# Patient Record
Sex: Male | Born: 1960 | Race: Black or African American | Hispanic: No | Marital: Single | Smoking: Current every day smoker
Health system: Southern US, Community
[De-identification: ages and names within clinical notes are randomized; demographics above are authoritative.]

## PROBLEM LIST (undated history)

## (undated) DIAGNOSIS — K219 Gastro-esophageal reflux disease without esophagitis: Secondary | ICD-10-CM

## (undated) HISTORY — PX: COLONOSCOPY: SHX174

## (undated) HISTORY — DX: Gastro-esophageal reflux disease without esophagitis: K21.9

---

## 2005-01-08 ENCOUNTER — Ambulatory Visit (HOSPITAL_COMMUNITY): Admission: RE | Admit: 2005-01-08 | Discharge: 2005-01-08 | Payer: Self-pay | Admitting: Family Medicine

## 2006-01-06 ENCOUNTER — Emergency Department (HOSPITAL_COMMUNITY): Admission: EM | Admit: 2006-01-06 | Discharge: 2006-01-06 | Payer: Self-pay | Admitting: Family Medicine

## 2007-06-08 ENCOUNTER — Emergency Department (HOSPITAL_COMMUNITY): Admission: EM | Admit: 2007-06-08 | Discharge: 2007-06-08 | Payer: Self-pay | Admitting: Emergency Medicine

## 2007-09-17 ENCOUNTER — Emergency Department (HOSPITAL_COMMUNITY): Admission: EM | Admit: 2007-09-17 | Discharge: 2007-09-17 | Payer: Self-pay | Admitting: Emergency Medicine

## 2011-09-17 LAB — POCT I-STAT CREATININE: Operator id: 288331

## 2011-09-17 LAB — POCT CARDIAC MARKERS
CKMB, poc: 1.4
Myoglobin, poc: 68
Operator id: 288331
Troponin i, poc: <0.05

## 2011-09-17 LAB — I-STAT 8, (EC8 V) (CONVERTED LAB)
Glucose, Bld: 98
Potassium: 3.5
TCO2: 27
pCO2, Ven: 47.7
pH, Ven: 7.342 — ABNORMAL HIGH

## 2012-02-18 ENCOUNTER — Ambulatory Visit (INDEPENDENT_AMBULATORY_CARE_PROVIDER_SITE_OTHER): Payer: BC Managed Care – PPO | Admitting: Family Medicine

## 2012-02-18 VITALS — BP 101/68 | HR 59 | Temp 98.8°F | Resp 16 | Ht 68.0 in | Wt 178.0 lb

## 2012-02-18 DIAGNOSIS — E785 Hyperlipidemia, unspecified: Secondary | ICD-10-CM

## 2012-02-18 DIAGNOSIS — R21 Rash and other nonspecific skin eruption: Secondary | ICD-10-CM

## 2012-02-18 DIAGNOSIS — R5383 Other fatigue: Secondary | ICD-10-CM

## 2012-02-18 DIAGNOSIS — Z0000 Encounter for general adult medical examination without abnormal findings: Secondary | ICD-10-CM

## 2012-02-18 LAB — POCT URINALYSIS DIPSTICK
Bilirubin, UA: NEGATIVE
Glucose, UA: NEGATIVE
Ketones, UA: NEGATIVE
Leukocytes, UA: NEGATIVE
Nitrite, UA: NEGATIVE
Protein, UA: NEGATIVE
Spec Grav, UA: 1.025
Urobilinogen, UA: 0.2
pH, UA: 5

## 2012-02-18 LAB — POCT CBC
Granulocyte percent: 37 %G (ref 37–80)
HCT, POC: 48.7 % (ref 43.5–53.7)
Hemoglobin: 15.8 g/dL (ref 14.1–18.1)
Lymph, poc: 2.6 (ref 0.6–3.4)
MCH, POC: 29.5 pg (ref 27–31.2)
MCHC: 32.4 g/dL (ref 31.8–35.4)
MCV: 90.9 fL (ref 80–97)
MID (cbc): 0.5 (ref 0–0.9)
MPV: 9.6 fL (ref 0–99.8)
POC Granulocyte: 1.8 — AB (ref 2–6.9)
POC LYMPH PERCENT: 52.4 %L — AB (ref 10–50)
POC MID %: 10.6 %M (ref 0–12)
Platelet Count, POC: 224 10*3/uL (ref 142–424)
RBC: 5.36 M/uL (ref 4.69–6.13)
RDW, POC: 13.5 %
WBC: 5 10*3/uL (ref 4.6–10.2)

## 2012-02-18 LAB — POCT UA - MICROSCOPIC ONLY
Amorphous: POSITIVE
Bacteria, U Microscopic: NEGATIVE
Casts, Ur, LPF, POC: NEGATIVE
Crystals, Ur, HPF, POC: NEGATIVE
Epithelial cells, urine per micros: NEGATIVE
Mucus, UA: NEGATIVE
WBC, Ur, HPF, POC: NEGATIVE
Yeast, UA: NEGATIVE

## 2012-02-18 NOTE — Progress Notes (Signed)
51 year old man from [place] who works full-time in the [employer] and also [employer] in his spare time. He is married with 5 children age 81-1/2-23. He was born with a congenital alternating exophoria. Been feeling fatigued now for quite some time and he thinks his partially related to his hard work to [employer].  Objective: No acute distress presently.  HEENT: Unremarkable except for the alternating exophoria.  Neck: Supple no adenopathy  Chest: Clear to auscultation, acute scars on thoracic spine  Heart: Regular no murmur or gallop  Abdomen: Soft nontender without HSM or masses  Genitalia: Annular half centimeters lesion with scab-like surface on the right upper scrotum, circumcise, otherwise in normal  Assessment: Fatigue, scrotal rash, exophoria.  Plan usual lab labs plus RPR, check TSH for fatigue him a set up for colonoscopy

## 2012-02-18 NOTE — Patient Instructions (Signed)
heaHealth Maintenance, Males A healthy lifestyle and preventative care can promote health and wellness.  Maintain regular health, dental, and eye exams.   Eat a healthy diet. Foods like vegetables, fruits, whole grains, low-fat dairy products, and lean protein foods contain the nutrients you need without too many calories. Decrease your intake of foods high in solid fats, added sugars, and salt. Get information about a proper diet from your caregiver, if necessary.   Regular physical exercise is one of the most important things you can do for your health. Most adults should get at least 150 minutes of moderate-intensity exercise (any activity that increases your heart rate and causes you to sweat) each week. In addition, most adults need muscle-strengthening exercises on 2 or more days a week.    Maintain a healthy weight. The body mass index (BMI) is a screening tool to identify possible weight problems. It provides an estimate of body fat based on height and weight. Your caregiver can help determine your BMI, and can help you achieve or maintain a healthy weight. For adults 20 years and older:   A BMI below 18.5 is considered underweight.   A BMI of 18.5 to 24.9 is normal.   A BMI of 25 to 29.9 is considered overweight.   A BMI of 30 and above is considered obese.   Maintain normal blood lipids and cholesterol by exercising and minimizing your intake of saturated fat. Eat a balanced diet with plenty of fruits and vegetables. Blood tests for lipids and cholesterol should begin at age 3 and be repeated every 5 years. If your lipid or cholesterol levels are high, you are over 50, or you are a high risk for heart disease, you may need your cholesterol levels checked more frequently.Ongoing high lipid and cholesterol levels should be treated with medicines, if diet and exercise are not effective.   If you smoke, find out from your caregiver how to quit. If you do not use tobacco, do not start.     If you choose to drink alcohol, do not exceed 2 drinks per day. One drink is considered to be 12 ounces (355 mL) of beer, 5 ounces (148 mL) of wine, or 1.5 ounces (44 mL) of liquor.   Avoid use of street drugs. Do not share needles with anyone. Ask for help if you need support or instructions about stopping the use of drugs.   High blood pressure causes heart disease and increases the risk of stroke. Blood pressure should be checked at least every 1 to 2 years. Ongoing high blood pressure should be treated with medicines if weight loss and exercise are not effective.   If you are 55 to 52 years old, ask your caregiver if you should take aspirin to prevent heart disease.   Diabetes screening involves taking a blood sample to check your fasting blood sugar level. This should be done once every 3 years, after age 86, if you are within normal weight and without risk factors for diabetes. Testing should be considered at a younger age or be carried out more frequently if you are overweight and have at least 1 risk factor for diabetes.   Colorectal cancer can be detected and often prevented. Most routine colorectal cancer screening begins at the age of 87 and continues through age 29. However, your caregiver may recommend screening at an earlier age if you have risk factors for colon cancer. On a yearly basis, your caregiver may provide home test kits to check for  hidden blood in the stool. Use of a small camera at the end of a tube, to directly examine the colon (sigmoidoscopy or colonoscopy), can detect the earliest forms of colorectal cancer. Talk to your caregiver about this at age 1, when routine screening begins. Direct examination of the colon should be repeated every 5 to 10 years through age 34, unless early forms of pre-cancerous polyps or small growths are found.   Hepatitis C blood testing is recommended for all people born from [birth year] through 1966 and any individual with known risks for  hepatitis C.   Healthy men should no longer receive prostate-specific antigen (PSA) blood tests as part of routine cancer screening. Consult with your caregiver about prostate cancer screening.   Testicular cancer screening is not recommended for adolescents or adult males who have no symptoms. Screening includes self-exam, caregiver exam, and other screening tests. Consult with your caregiver about any symptoms you have or any concerns you have about testicular cancer.   Practice safe sex. Use condoms and avoid high-risk sexual practices to reduce the spread of sexually transmitted infections (STIs).   Use sunscreen with a sun protection factor (SPF) of 30 or greater. Apply sunscreen liberally and repeatedly throughout the day. You should seek shade when your shadow is shorter than you. Protect yourself by wearing long sleeves, pants, a wide-brimmed hat, and sunglasses year round, whenever you are outdoors.   Notify your caregiver of new moles or changes in moles, especially if there is a change in shape or color. Also notify your caregiver if a mole is larger than the size of a pencil eraser.   A one-time screening for abdominal aortic aneurysm (AAA) and surgical repair of large AAAs by sound wave imaging (ultrasonography) is recommended for ages 80 to 38 years who are current or former smokers.   Stay current with your immunizations.  Document Released: 05/16/2008 Document Revised: 11/07/2011 Document Reviewed: 04/15/2011 [facility] Patient Information 2012 [place], [place].

## 2012-02-19 LAB — LIPID PANEL
Cholesterol: 189 mg/dL (ref 0–200)
HDL: 42 mg/dL (ref >39)
LDL Cholesterol: 115 mg/dL — ABNORMAL HIGH (ref 0–99)
Total CHOL/HDL Ratio: 4.5 Ratio
Triglycerides: 162 mg/dL — ABNORMAL HIGH (ref <150)
VLDL: 32 mg/dL (ref 0–40)

## 2012-02-19 LAB — COMPREHENSIVE METABOLIC PANEL
ALT: 16 U/L (ref 0–53)
AST: 29 U/L (ref 0–37)
Albumin: 4.4 g/dL (ref 3.5–5.2)
Alkaline Phosphatase: 78 U/L (ref 39–117)
BUN: 15 mg/dL (ref 6–23)
CO2: 26 mEq/L (ref 19–32)
Calcium: 9.6 mg/dL (ref 8.4–10.5)
Chloride: 102 mEq/L (ref 96–112)
Creat: 1.21 mg/dL (ref 0.50–1.35)
Glucose, Bld: 83 mg/dL (ref 70–99)
Potassium: 4.1 mEq/L (ref 3.5–5.3)
Sodium: 139 mEq/L (ref 135–145)
Total Bilirubin: 0.4 mg/dL (ref 0.3–1.2)
Total Protein: 7.1 g/dL (ref 6.0–8.3)

## 2012-02-19 LAB — RPR

## 2012-02-19 LAB — TSH: TSH: 0.93 u[IU]/mL (ref 0.350–4.500)

## 2012-05-08 DIAGNOSIS — D126 Benign neoplasm of colon, unspecified: Secondary | ICD-10-CM | POA: Insufficient documentation

## 2012-08-27 ENCOUNTER — Encounter: Payer: Self-pay | Admitting: Family Medicine

## 2012-08-27 DIAGNOSIS — D126 Benign neoplasm of colon, unspecified: Secondary | ICD-10-CM

## 2012-11-30 ENCOUNTER — Ambulatory Visit (INDEPENDENT_AMBULATORY_CARE_PROVIDER_SITE_OTHER): Payer: BC Managed Care – PPO | Admitting: Family Medicine

## 2012-11-30 ENCOUNTER — Encounter: Payer: Self-pay | Admitting: Family Medicine

## 2012-11-30 VITALS — BP 118/76 | HR 60 | Temp 98.5°F | Resp 16 | Ht 68.0 in | Wt 179.2 lb

## 2012-11-30 DIAGNOSIS — F17200 Nicotine dependence, unspecified, uncomplicated: Secondary | ICD-10-CM

## 2012-11-30 DIAGNOSIS — Z720 Tobacco use: Secondary | ICD-10-CM

## 2012-11-30 DIAGNOSIS — J329 Chronic sinusitis, unspecified: Secondary | ICD-10-CM

## 2012-11-30 DIAGNOSIS — J40 Bronchitis, not specified as acute or chronic: Secondary | ICD-10-CM

## 2012-11-30 DIAGNOSIS — J029 Acute pharyngitis, unspecified: Secondary | ICD-10-CM

## 2012-11-30 MED ORDER — HYDROCODONE-HOMATROPINE 5-1.5 MG/5ML PO SYRP: 5.0000 mL | ORAL_SOLUTION | ORAL | Status: DC | PRN

## 2012-11-30 MED ORDER — AMOXICILLIN 875 MG PO TABS: 875.0000 mg | ORAL_TABLET | Freq: Two times a day (BID) | ORAL | Status: DC

## 2012-11-30 NOTE — Patient Instructions (Signed)
Drink lots of fluids  Take medications as directed  Return if fever or cough or shortness of breath get worse.  Make a decision to quit smoking. Do not say you're going to try to quit smoking, but that you are going to quit smoking. Try to taper back to about 3 cigarettes a day before the day you were going to quit.Pick a day on which you're going to quit, throw away the cigarettes, and do not touch another one. You are addicted to the cigarettes, and if you smoke Probably go back to smoking. You must make up your mind that you're not going to smoke any.

## 2012-11-30 NOTE — Progress Notes (Signed)
Subjective: Patient has been ill for over a week with a respiratory problem. It started as a head cold and now has moved into his chest. He has some sore throat. He is coughing. He has had fevers. his children have been sick.. He does still smoke. We had a discussion about his need to quit. He is only smoking about half a dozen cigarettes a day. I told him he does not need to try to quit, he just needs to make up his mind and do it.  Objective: No acute distress. Wheezy on the right. Throat mildly erythematous. Neck supple small nodes. His TMs are normal. Chest is clear to auscultation. Heart regular.  Assessment: Sinusitis/pharyngitis/bronchitis  Plan: Amoxicillin Cough medicine Return if worse.

## 2013-11-30 ENCOUNTER — Ambulatory Visit (INDEPENDENT_AMBULATORY_CARE_PROVIDER_SITE_OTHER): Payer: BC Managed Care – PPO | Admitting: Emergency Medicine

## 2013-11-30 VITALS — BP 100/60 | HR 58 | Temp 98.9°F | Resp 18 | Ht 68.5 in | Wt 180.0 lb

## 2013-11-30 DIAGNOSIS — Z0000 Encounter for general adult medical examination without abnormal findings: Secondary | ICD-10-CM

## 2013-11-30 LAB — POCT CBC
HCT, POC: 53.2 % (ref 43.5–53.7)
Hemoglobin: 17.2 g/dL (ref 14.1–18.1)
Lymph, poc: 2.6 (ref 0.6–3.4)
MCH, POC: 31 pg (ref 27–31.2)
MCHC: 32.3 g/dL (ref 31.8–35.4)
MPV: 10.3 fL (ref 0–99.8)
POC Granulocyte: 2.4 (ref 2–6.9)
POC LYMPH PERCENT: 48.7 %L (ref 10–50)
POC MID %: 7.5 %M (ref 0–12)
RDW, POC: 13.7 %
WBC: 5.4 10*3/uL (ref 4.6–10.2)

## 2013-11-30 LAB — POCT URINALYSIS DIPSTICK
Bilirubin, UA: NEGATIVE
Ketones, UA: NEGATIVE
Protein, UA: NEGATIVE
pH, UA: 5.5

## 2013-11-30 NOTE — Progress Notes (Signed)
[facility] [address] [place] [address] [phone]  Date:  11/30/2013   Name:  Dennis Wiggins   DOB:  04-12-1961   MRN:  [id number]  PCP:  No primary provider on file.    Chief Complaint: Annual Exam   History of Present Illness:  Dennis Wiggins is a 52 y.o. very pleasant male patient who presents with the following:  Comes for a physical.  No medications, no medical problems.  Has pain in his chest when he is at work with lifting and straining. Never at home with exertion, eating, sex, etc.  Smokes less than 1 ppd.  No flu shot.  No improvement with over the counter medications or other home remedies. Denies other complaint or health concern today.   Had colonoscopy 6 months ago  Patient Active Problem List   Diagnosis Date Noted  . Benign neoplasm of colon 05/08/2012    History reviewed. No pertinent past medical history.  History reviewed. No pertinent past surgical history.  History  Substance Use Topics  . Smoking status: Current Every Day Smoker  . Smokeless tobacco: Not on file  . Alcohol Use: Not on file    Family History  Problem Relation Age of Onset  . Cancer Mother     No Known Allergies  Medication list has been reviewed and updated.  Current Outpatient Prescriptions on File Prior to Visit  Medication Sig Dispense Refill  . amoxicillin (AMOXIL) 875 MG tablet Take 1 tablet (875 mg total) by mouth 2 (two) times daily.  20 tablet  0  . HYDROcodone-homatropine (HYCODAN) 5-1.5 MG/5ML syrup Take 5 mLs by mouth every 4 (four) hours as needed for cough.  120 mL  0   No current facility-administered medications on file prior to visit.    Review of Systems:  As per HPI, otherwise negative.    Physical Examination: Filed Vitals:   11/30/13 1241  BP: 100/60  Pulse: 58  Temp: 98.9 F (37.2 C)  Resp: 18   Filed Vitals:   11/30/13 1241  Height: 5' 8.5" (1.74 m)  Weight: 180 lb (81.647 kg)   Body mass index is 26.97  kg/(m^2). Ideal Body Weight: Weight in (lb) to have BMI = 25: 166.5  GEN: WDWN, NAD, Non-toxic, A & O x 3 HEENT: Atraumatic, Normocephalic. Neck supple. No masses, No LAD.  Strabismus Ears and Nose: No external deformity. CV: RRR, No M/G/R. No JVD. No thrill. No extra heart sounds. PULM: CTA B, no wheezes, crackles, rhonchi. No retractions. No resp. distress. No accessory muscle use. ABD: S, NT, ND, +BS. No rebound. No HSM. EXTR: No c/c/e NEURO Normal gait.  PSYCH: Normally interactive. Conversant. Not depressed or anxious appearing.  Calm demeanor.    Assessment and Plan: CPE Labs  Signed,  Phillips Odor, MD

## 2013-12-01 ENCOUNTER — Telehealth: Payer: Self-pay

## 2013-12-01 LAB — LIPID PANEL
Cholesterol: 144 mg/dL (ref 0–200)
HDL: 36 mg/dL — ABNORMAL LOW (ref >39)
Total CHOL/HDL Ratio: 4 Ratio
Triglycerides: 196 mg/dL — ABNORMAL HIGH (ref <150)

## 2013-12-01 LAB — COMPREHENSIVE METABOLIC PANEL
BUN: 10 mg/dL (ref 6–23)
CO2: 29 mEq/L (ref 19–32)
Calcium: 9.7 mg/dL (ref 8.4–10.5)
Chloride: 103 mEq/L (ref 96–112)
Creat: 1.08 mg/dL (ref 0.50–1.35)
Total Bilirubin: 0.4 mg/dL (ref 0.3–1.2)

## 2013-12-01 NOTE — Telephone Encounter (Signed)
Pt calling for lab results.

## 2013-12-02 NOTE — Telephone Encounter (Signed)
Patient has been advised

## 2014-09-19 ENCOUNTER — Ambulatory Visit (INDEPENDENT_AMBULATORY_CARE_PROVIDER_SITE_OTHER): Payer: BC Managed Care – PPO | Admitting: Physician Assistant

## 2014-09-19 VITALS — BP 122/76 | HR 65 | Temp 98.8°F | Resp 17 | Ht 68.5 in | Wt 179.0 lb

## 2014-09-19 DIAGNOSIS — J069 Acute upper respiratory infection, unspecified: Secondary | ICD-10-CM

## 2014-09-19 DIAGNOSIS — R059 Cough, unspecified: Secondary | ICD-10-CM

## 2014-09-19 DIAGNOSIS — H9312 Tinnitus, left ear: Secondary | ICD-10-CM

## 2014-09-19 DIAGNOSIS — R05 Cough: Secondary | ICD-10-CM

## 2014-09-19 DIAGNOSIS — H509 Unspecified strabismus: Secondary | ICD-10-CM

## 2014-09-19 DIAGNOSIS — H5052 Exophoria: Secondary | ICD-10-CM | POA: Insufficient documentation

## 2014-09-19 MED ORDER — IPRATROPIUM BROMIDE 0.03 % NA SOLN: 2.0000 | Freq: Two times a day (BID) | NASAL | Status: DC

## 2014-09-19 MED ORDER — GUAIFENESIN ER 1200 MG PO TB12: 1.0000 | ORAL_TABLET | Freq: Two times a day (BID) | ORAL | Status: DC | PRN

## 2014-09-19 MED ORDER — ALBUTEROL SULFATE (2.5 MG/3ML) 0.083% IN NEBU
2.5000 mg | INHALATION_SOLUTION | Freq: Once | RESPIRATORY_TRACT | Status: AC
  Administered 2014-09-19: 2.5 mg via RESPIRATORY_TRACT

## 2014-09-19 MED ORDER — HYDROCOD POLST-CHLORPHEN POLST 10-8 MG/5ML PO LQCR: 5.0000 mL | Freq: Two times a day (BID) | ORAL | Status: DC | PRN

## 2014-09-19 MED ORDER — ALBUTEROL SULFATE HFA 108 (90 BASE) MCG/ACT IN AERS: 2.0000 | INHALATION_SPRAY | RESPIRATORY_TRACT | Status: DC | PRN

## 2014-09-19 MED ORDER — IPRATROPIUM BROMIDE 0.02 % IN SOLN
0.5000 mg | Freq: Once | RESPIRATORY_TRACT | Status: AC
  Administered 2014-09-19: 0.5 mg via RESPIRATORY_TRACT

## 2014-09-19 NOTE — Progress Notes (Signed)
Subjective:    Patient ID: Dennis Wiggins, male    DOB: Oct 21, 1961, 53 y.o.   MRN: [id number]   PCP: No primary provider on file.  Chief Complaint  Patient presents with  . Cough  . URI  . Headache  . Otalgia    Medications, allergies, past medical history, surgical history, family history, social history and problem list reviewed and updated.  HPI  This 53 y.o. male presents for evaluation of upper respiratory illness.  3 days of chills, runny nose, sneezing, headache. Chest congestion and wheezing. No sore throat. No ear pain. Some cough. No known sick contacts. No myalgias/arthralgias or rash.  In addition, he reports Pulsatile hum in the LEFT ear for about a month. Only occurs at bedtime.   Review of Systems As above.    Objective:   Physical Exam  Vitals reviewed. Constitutional: He is oriented to person, place, and time. Vital signs are normal. He appears well-developed and well-nourished. No distress.  BP 122/76  Pulse 65  Temp(Src) 98.8 F (37.1 C) (Oral)  Resp 17  Ht 5' 8.5" (1.74 m)  Wt 179 lb (81.194 kg)  BMI 26.82 kg/m2  SpO2 97%   HENT:  Head: Normocephalic and atraumatic.  Right Ear: Hearing, tympanic membrane, external ear and ear canal normal.  Left Ear: Hearing and external ear normal. A foreign body (tip of a cotton swab initially obscurred the TM. Removed with alligator forceps) is present. Tympanic membrane is retracted (mildly, and dulled to light). Tympanic membrane is not injected, not scarred, not perforated and not erythematous.  Nose: Mucosal edema present.  No foreign bodies. Right sinus exhibits no maxillary sinus tenderness and no frontal sinus tenderness. Left sinus exhibits no maxillary sinus tenderness and no frontal sinus tenderness.  Mouth/Throat: Uvula is midline, oropharynx is clear and moist and mucous membranes are normal. No uvula swelling. No oropharyngeal exudate.  Eyes: Conjunctivae are normal. Pupils are equal, round, and  reactive to light. Right eye exhibits no discharge. Left eye exhibits no discharge. No scleral icterus. Right eye exhibits abnormal extraocular motion (Strabismus).  Neck: Trachea normal, normal range of motion and full passive range of motion without pain. Neck supple. No mass and no thyromegaly present.  Cardiovascular: Normal rate, regular rhythm and normal heart sounds.   Pulmonary/Chest: Effort normal and breath sounds normal.  Very loud upper airway sounds, initially difficult to distinguish vs rhonchi; after neb, more clear that the sounds are associated with phlegm in the throat, not in the lungs.  Lymphadenopathy:       Head (right side): No submandibular, no tonsillar, no preauricular, no posterior auricular and no occipital adenopathy present.       Head (left side): No submandibular, no tonsillar, no preauricular and no occipital adenopathy present.    He has no cervical adenopathy.       Right: No supraclavicular adenopathy present.       Left: No supraclavicular adenopathy present.  Neurological: He is alert and oriented to person, place, and time. He has normal strength. No cranial nerve deficit or sensory deficit.  Skin: Skin is warm, dry and intact. No rash noted.  Psychiatric: He has a normal mood and affect. His speech is normal and behavior is normal.   No improvement in symptoms nor change in exam with albuterol + atrovent neb.         Assessment & Plan:  1. Acute upper respiratory infection Supportive care.  Reassess if symptoms worsen/persist. - albuterol (PROVENTIL) (2.5  MG/3ML) 0.083% nebulizer solution 2.5 mg; Take 3 mLs (2.5 mg total) by nebulization once. - ipratropium (ATROVENT) nebulizer solution 0.5 mg; Take 2.5 mLs (0.5 mg total) by nebulization once. - ipratropium (ATROVENT) 0.03 % nasal spray; Place 2 sprays into both nostrils 2 (two) times daily.  Dispense: 30 mL; Refill: 0 - Guaifenesin (MUCINEX MAXIMUM STRENGTH) 1200 MG TB12; Take 1 tablet (1,200 mg  total) by mouth every 12 (twelve) hours as needed.  Dispense: 14 tablet; Refill: 1  2. Cough See above. - chlorpheniramine-HYDROcodone (TUSSIONEX PENNKINETIC ER) 10-8 MG/5ML LQCR; Take 5 mLs by mouth every 12 (twelve) hours as needed for cough (cough).  Dispense: 100 mL; Refill: 0 - albuterol (PROVENTIL HFA;VENTOLIN HFA) 108 (90 BASE) MCG/ACT inhaler; Inhale 2 puffs into the lungs every 4 (four) hours as needed for wheezing or shortness of breath (cough, shortness of breath or wheezing.).  Dispense: 1 Inhaler; Refill: 1  3. Tinnitus of left ear If symptoms persist after treatment of URI, now that FB removed from the ear, will refer to ENT for additional evaluation.  Counseled against the use of cotton swabs in the ear.  Fara Chute, PA-C Physician Assistant-Certified [facility]

## 2014-09-19 NOTE — Patient Instructions (Signed)
Get plenty of rest and drink at least 64 ounces of water daily.  If the humming in your ear at night continues, come back for re-evaluation when you are well.  When you are well, please get a flu vaccine.

## 2014-12-12 ENCOUNTER — Ambulatory Visit (INDEPENDENT_AMBULATORY_CARE_PROVIDER_SITE_OTHER): Payer: BLUE CROSS/BLUE SHIELD | Admitting: Physician Assistant

## 2014-12-12 DIAGNOSIS — H539 Unspecified visual disturbance: Secondary | ICD-10-CM

## 2014-12-12 LAB — POCT CBC
Granulocyte percent: 61.7 %G (ref 37–80)
HCT, POC: 48 % (ref 43.5–53.7)
Hemoglobin: 16.3 g/dL (ref 14.1–18.1)
LYMPH, POC: 2.4 (ref 0.6–3.4)
MCH: 31.3 pg — AB (ref 27–31.2)
MCHC: 34 g/dL (ref 31.8–35.4)
MCV: 91.9 fL (ref 80–97)
MID (CBC): 0.1 (ref 0–0.9)
MPV: 8.7 fL (ref 0–99.8)
PLATELET COUNT, POC: 168 10*3/uL (ref 142–424)
POC Granulocyte: 4.1 (ref 2–6.9)
POC LYMPH PERCENT: 36.2 %L (ref 10–50)
POC MID %: 2.1 %M (ref 0–12)
RBC: 5.22 M/uL (ref 4.69–6.13)
RDW, POC: 14.8 %
WBC: 6.7 10*3/uL (ref 4.6–10.2)

## 2014-12-12 LAB — GLUCOSE, POCT (MANUAL RESULT ENTRY): POC Glucose: 65 mg/dl — AB (ref 70–99)
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
## 2015-10-16 ENCOUNTER — Ambulatory Visit (INDEPENDENT_AMBULATORY_CARE_PROVIDER_SITE_OTHER): Payer: BLUE CROSS/BLUE SHIELD | Admitting: Family Medicine

## 2015-10-16 VITALS — BP 100/64 | HR 66 | Temp 98.8°F | Resp 18 | Ht 68.5 in | Wt 185.4 lb

## 2015-10-16 DIAGNOSIS — Z23 Encounter for immunization: Secondary | ICD-10-CM | POA: Diagnosis not present

## 2015-10-16 DIAGNOSIS — F17200 Nicotine dependence, unspecified, uncomplicated: Secondary | ICD-10-CM

## 2015-10-16 DIAGNOSIS — K219 Gastro-esophageal reflux disease without esophagitis: Secondary | ICD-10-CM | POA: Diagnosis not present

## 2015-10-16 DIAGNOSIS — R109 Unspecified abdominal pain: Secondary | ICD-10-CM

## 2015-10-16 DIAGNOSIS — R1010 Upper abdominal pain, unspecified: Secondary | ICD-10-CM | POA: Diagnosis not present

## 2015-10-16 DIAGNOSIS — Z125 Encounter for screening for malignant neoplasm of prostate: Secondary | ICD-10-CM

## 2015-10-16 LAB — POCT CBC
Granulocyte percent: 49.4 %G (ref 37–80)
HCT, POC: 48.5 % (ref 43.5–53.7)
HEMOGLOBIN: 16.6 g/dL (ref 14.1–18.1)
LYMPH, POC: 2.1 (ref 0.6–3.4)
MCH, POC: 30.7 pg (ref 27–31.2)
MCHC: 34.3 g/dL (ref 31.8–35.4)
MCV: 89.6 fL (ref 80–97)
MID (cbc): 0.4 (ref 0–0.9)
MPV: 8.9 fL (ref 0–99.8)
PLATELET COUNT, POC: 159 10*3/uL (ref 142–424)
POC Granulocyte: 2.4 (ref 2–6.9)
POC LYMPH PERCENT: 42.8 %L (ref 10–50)
POC MID %: 7.8 %M (ref 0–12)
RBC: 5.41 M/uL (ref 4.69–6.13)
RDW, POC: 13.5 %
WBC: 4.9 10*3/uL (ref 4.6–10.2)

## 2015-10-16 LAB — COMPREHENSIVE METABOLIC PANEL
ALT: 13 U/L (ref 9–46)
AST: 20 U/L (ref 10–35)
Albumin: 4.3 g/dL (ref 3.6–5.1)
Alkaline Phosphatase: 68 U/L (ref 40–115)
BILIRUBIN TOTAL: 0.6 mg/dL (ref 0.2–1.2)
BUN: 14 mg/dL (ref 7–25)
CO2: 26 mmol/L (ref 20–31)
Calcium: 9.7 mg/dL (ref 8.6–10.3)
Chloride: 104 mmol/L (ref 98–110)
Creat: 1.02 mg/dL (ref 0.70–1.33)
GLUCOSE: 85 mg/dL (ref 65–99)
Potassium: 4.7 mmol/L (ref 3.5–5.3)
SODIUM: 140 mmol/L (ref 135–146)
TOTAL PROTEIN: 7.3 g/dL (ref 6.1–8.1)

## 2015-10-16 LAB — POCT URINALYSIS DIP (MANUAL ENTRY)
BILIRUBIN UA: NEGATIVE
Glucose, UA: NEGATIVE
Ketones, POC UA: NEGATIVE
LEUKOCYTES UA: NEGATIVE
NITRITE UA: NEGATIVE
PH UA: 7.5
PROTEIN UA: NEGATIVE
Spec Grav, UA: 1.015
UROBILINOGEN UA: 0.2

## 2015-10-16 LAB — POC MICROSCOPIC URINALYSIS (UMFC): Mucus: ABSENT

## 2015-10-16 MED ORDER — MELOXICAM 15 MG PO TABS: 15.0000 mg | ORAL_TABLET | Freq: Every day | ORAL | Status: DC

## 2015-10-16 MED ORDER — CYCLOBENZAPRINE HCL 10 MG PO TABS: 10.0000 mg | ORAL_TABLET | Freq: Every day | ORAL | Status: DC

## 2015-10-16 MED ORDER — OMEPRAZOLE 40 MG PO CPDR: 40.0000 mg | DELAYED_RELEASE_CAPSULE | Freq: Every day | ORAL | Status: DC

## 2015-10-16 NOTE — Progress Notes (Signed)
Subjective:    Patient ID: Dennis Wiggins, male    DOB: [birth year], 54 y.o.   MRN: [id number] Chief Complaint  Patient presents with  . Flank Pain    left side, x2 weeks  . burning sensation in chest  . Flu Vaccine    HPI  For less than 2 weeks pt has had a left flank pain radiating superiorly.  No changes in bowels or urine. It is intermittent.  He is not sure what triggers the pain and feels that movement and twisting aggravates it.  He does have a physical job which makes the job worse.  He has some ibuprofen which he sometimes takes before work but he is unsure if it helps.  Did have similar pain several years prior but he does not remember what helped this.  No rash, has had some chills, no fevers. No change in appetite/activity  Has also has some acid reflux and heart burn that has been worsening. Can cause severe chest pain.  Not taking any medications for this.  He has an appt in 1 week for a CPE with English PA-C.  He reports he did have a colonoscopy several years ago that was normal - had a hyperplastic polyp by Dr. Collene Mares 05/2012.  Seen at [facility] by Dr. Woody Seller 03/2014 - pt was having atypical CP that was suspected to be MSK.  History reviewed. No pertinent past medical history. History reviewed. No pertinent past surgical history. Current Outpatient Prescriptions on File Prior to Visit  Medication Sig Dispense Refill  . albuterol (PROVENTIL HFA;VENTOLIN HFA) 108 (90 BASE) MCG/ACT inhaler Inhale 2 puffs into the lungs every 4 (four) hours as needed for wheezing or shortness of breath (cough, shortness of breath or wheezing.). (Patient not taking: Reported on 12/12/2014) 1 Inhaler 1   No current facility-administered medications on file prior to visit.   No Known Allergies Family History  Problem Relation Age of Onset  . Cancer Mother     unknown type; had chemotherapy   Social History   Social History  . Marital Status: Single    Spouse Name: N/A  . Number  of Children: 4  . Years of Education: 12th grade   Occupational History  . vault caster    Social History Main Topics  . Smoking status: Current Every Day Smoker -- 0.50 packs/day for 13 years    Types: Cigarettes  . Smokeless tobacco: Never Used  . Alcohol Use: None  . Drug Use: None  . Sexual Activity: Not Asked   Other Topics Concern  . None   Social History Narrative   From [place]. Came to the [place] in 2003.     Review of Systems  Constitutional: Negative for fever, chills, diaphoresis, activity change, appetite change and unexpected weight change.  Respiratory: Negative for shortness of breath.   Cardiovascular: Negative for chest pain.  Gastrointestinal: Positive for abdominal pain. Negative for nausea, vomiting, diarrhea, constipation, blood in stool, abdominal distention, anal bleeding and rectal pain.  Genitourinary: Positive for flank pain. Negative for dysuria, urgency, frequency, hematuria, decreased urine volume and enuresis.  Musculoskeletal: Positive for back pain and arthralgias. Negative for joint swelling and gait problem.  Hematological: Negative for adenopathy.       Objective:  BP 100/64 mmHg  Pulse 66  Temp(Src) 98.8 F (37.1 C) (Oral)  Resp 18  Ht 5' 8.5" (1.74 m)  Wt 185 lb 6.4 oz (84.097 kg)  BMI 27.78 kg/m2  SpO2 96%  Physical Exam  Constitutional: He is oriented to person, place, and time. He appears well-developed and well-nourished. No distress.  HENT:  Head: Normocephalic and atraumatic.  Eyes: Conjunctivae are normal. Pupils are equal, round, and reactive to light. No scleral icterus.  Neck: Normal range of motion. Neck supple. No thyromegaly present.  Cardiovascular: Normal rate, regular rhythm, normal heart sounds and intact distal pulses.   Pulmonary/Chest: Effort normal and breath sounds normal. No respiratory distress.  Abdominal: Soft. Normal appearance and bowel sounds are normal. He exhibits no distension and no mass. There is no  hepatosplenomegaly. There is no tenderness. There is no rebound, no guarding and no CVA tenderness. A hernia is present. Hernia confirmed positive in the ventral area.  Musculoskeletal: He exhibits no edema.  Lymphadenopathy:    He has no cervical adenopathy.  Neurological: He is alert and oriented to person, place, and time.  Skin: Skin is warm and dry. He is not diaphoretic.  Psychiatric: He has a normal mood and affect. His behavior is normal.      Results for orders placed or performed in visit on 10/16/15  POCT urinalysis dipstick  Result Value Ref Range   Color, UA yellow yellow   Clarity, UA clear clear   Glucose, UA negative negative   Bilirubin, UA negative negative   Ketones, POC UA negative negative   Spec Grav, UA 1.015    Blood, UA trace-lysed (A) negative   pH, UA 7.5    Protein Ur, POC negative negative   Urobilinogen, UA 0.2    Nitrite, UA Negative Negative   Leukocytes, UA Negative Negative  POCT Microscopic Urinalysis ([facility])  Result Value Ref Range   WBC,UR,HPF,POC None None WBC/hpf   RBC,UR,HPF,POC None None RBC/hpf   Bacteria None None, Too numerous to count   Mucus Absent Absent   Epithelial Cells, UR Per Microscopy None None, Too numerous to count cells/hpf  POCT CBC  Result Value Ref Range   WBC 4.9 4.6 - 10.2 K/uL   Lymph, poc 2.1 0.6 - 3.4   POC LYMPH PERCENT 42.8 10 - 50 %L   MID (cbc) 0.4 0 - 0.9   POC MID % 7.8 0 - 12 %M   POC Granulocyte 2.4 2 - 6.9   Granulocyte percent 49.4 37 - 80 %G   RBC 5.41 4.69 - 6.13 M/uL   Hemoglobin 16.6 14.1 - 18.1 g/dL   HCT, POC 48.5 43.5 - 53.7 %   MCV 89.6 80 - 97 fL   MCH, POC 30.7 27 - 31.2 pg   MCHC 34.3 31.8 - 35.4 g/dL   RDW, POC 13.5 %   Platelet Count, POC 159 142 - 424 K/uL   MPV 8.9 0 - 99.8 fL   Assessment & Plan:  Pt not fasting today - instructed to be fasting for CPE next wk for lipids. 1. Acute flank pain - exam nml - suspect MSK so start qam meloxicam and qhs flexeril. Recheck at CPE with  English in 1 wk  2. Gastroesophageal reflux disease, esophagitis presence not specified - h. Pylori done today due to severity and freq of sxs. Start ppi.    3. Screening for prostate cancer   4. Tobacco use disorder   5. Need for prophylactic vaccination and inoculation against influenza    ADDENDUM: + h. Pylori - will send in triple therapy with amox and biaxin bid x 2 wks. Take ppi bid during this time as well, then decrase to qd and stop after 6-8 weeks.  Will ask English to review this with pt at his visit this week.  Orders Placed This Encounter  Procedures  . Flu Vaccine QUAD 36+ mos IM  . H. pylori breath test  . Comprehensive metabolic panel  . PSA  . POCT urinalysis dipstick  . POCT Microscopic Urinalysis ([facility])  . POCT CBC    Meds ordered this encounter  Medications  . meloxicam (MOBIC) 15 MG tablet    Sig: Take 1 tablet (15 mg total) by mouth daily. Do not use with any other otc pain medication other than tylenol/acetaminophen    Dispense:  30 tablet    Refill:  0  . omeprazole (PRILOSEC) 40 MG capsule    Sig: Take 1 capsule (40 mg total) by mouth daily.    Dispense:  30 capsule    Refill:  3  . cyclobenzaprine (FLEXERIL) 10 MG tablet    Sig: Take 1 tablet (10 mg total) by mouth at bedtime.    Dispense:  30 tablet    Refill:  0    Delman Cheadle, MD MPH

## 2015-10-16 NOTE — Patient Instructions (Addendum)
Do not use any otc pain medication other than tylenol/acetaminophen - so no aleve, ibuprofen, motrin, advil, etc.  Food Choices for Gastroesophageal Reflux Disease, Adult When you have gastroesophageal reflux disease (GERD), the foods you eat and your eating habits are very important. Choosing the right foods can help ease the discomfort of GERD. WHAT GENERAL GUIDELINES DO I NEED TO FOLLOW?  Choose fruits, vegetables, whole grains, low-fat dairy products, and low-fat meat, fish, and poultry.  Limit fats such as oils, salad dressings, butter, nuts, and avocado.  Keep a food diary to identify foods that cause symptoms.  Avoid foods that cause reflux. These may be different for different people.  Eat frequent small meals instead of three large meals each day.  Eat your meals slowly, in a relaxed setting.  Limit fried foods.  Cook foods using methods other than frying.  Avoid drinking alcohol.  Avoid drinking large amounts of liquids with your meals.  Avoid bending over or lying down until 2-3 hours after eating. WHAT FOODS ARE NOT RECOMMENDED? The following are some foods and drinks that may worsen your symptoms: Vegetables Tomatoes. Tomato juice. Tomato and spaghetti sauce. Chili peppers. Onion and garlic. Horseradish. Fruits Oranges, grapefruit, and lemon (fruit and juice). Meats High-fat meats, fish, and poultry. This includes hot dogs, ribs, ham, sausage, salami, and bacon. Dairy Whole milk and chocolate milk. Sour cream. Cream. Butter. Ice cream. Cream cheese.  Beverages Coffee and tea, with or without caffeine. Carbonated beverages or energy drinks. Condiments Hot sauce. Barbecue sauce.  Sweets/Desserts Chocolate and cocoa. Donuts. Peppermint and spearmint. Fats and Oils High-fat foods, including [place] fries and potato chips. Other Vinegar. Strong spices, such as black pepper, white pepper, red pepper, cayenne, curry powder, cloves, ginger, and chili powder. The  items listed above may not be a complete list of foods and beverages to avoid. Contact your dietitian for more information.   This information is not intended to replace advice given to you by your health care provider. Make sure you discuss any questions you have with your health care provider.   Document Released: 11/18/2005 Document Revised: 12/09/2014 Document Reviewed: 09/22/2013 Elsevier Interactive Patient Education 2016 Elsevier Inc.  Back Pain, Adult Back pain is very common in adults.The cause of back pain is rarely dangerous and the pain often gets better over time.The cause of your back pain may not be known. Some common causes of back pain include:  Strain of the muscles or ligaments supporting the spine.  Wear and tear (degeneration) of the spinal disks.  Arthritis.  Direct injury to the back. For many people, back pain may return. Since back pain is rarely dangerous, most people can learn to manage this condition on their own. HOME CARE INSTRUCTIONS Watch your back pain for any changes. The following actions may help to lessen any discomfort you are feeling:  Remain active. It is stressful on your back to sit or stand in one place for long periods of time. Do not sit, drive, or stand in one place for more than 30 minutes at a time. Take short walks on even surfaces as soon as you are able.Try to increase the length of time you walk each day.  Exercise regularly as directed by your health care provider. Exercise helps your back heal faster. It also helps avoid future injury by keeping your muscles strong and flexible.  Do not stay in bed.Resting more than 1-2 days can delay your recovery.  Pay attention to your body when you bend  and lift. The most comfortable positions are those that put less stress on your recovering back. Always use proper lifting techniques, including:  Bending your knees.  Keeping the load close to your body.  Avoiding twisting.  Find a  comfortable position to sleep. Use a firm mattress and lie on your side with your knees slightly bent. If you lie on your back, put a pillow under your knees.  Avoid feeling anxious or stressed.Stress increases muscle tension and can worsen back pain.It is important to recognize when you are anxious or stressed and learn ways to manage it, such as with exercise.  Take medicines only as directed by your health care provider. Over-the-counter medicines to reduce pain and inflammation are often the most helpful.Your health care provider may prescribe muscle relaxant drugs.These medicines help dull your pain so you can more quickly return to your normal activities and healthy exercise.  Apply ice to the injured area:  Put ice in a plastic bag.  Place a towel between your skin and the bag.  Leave the ice on for 20 minutes, 2-3 times a day for the first 2-3 days. After that, ice and heat may be alternated to reduce pain and spasms.  Maintain a healthy weight. Excess weight puts extra stress on your back and makes it difficult to maintain good posture. SEEK MEDICAL CARE IF:  You have pain that is not relieved with rest or medicine.  You have increasing pain going down into the legs or buttocks.  You have pain that does not improve in one week.  You have night pain.  You lose weight.  You have a fever or chills. SEEK IMMEDIATE MEDICAL CARE IF:   You develop new bowel or bladder control problems.  You have unusual weakness or numbness in your arms or legs.  You develop nausea or vomiting.  You develop abdominal pain.  You feel faint.   This information is not intended to replace advice given to you by your health care provider. Make sure you discuss any questions you have with your health care provider.   Document Released: 11/18/2005 Document Revised: 12/09/2014 Document Reviewed: 03/22/2014 Elsevier Interactive Patient Education [employer].

## 2015-10-17 LAB — H. PYLORI BREATH TEST: H. pylori Breath Test: DETECTED — AB

## 2015-10-17 LAB — PSA: PSA: 1.64 ng/mL (ref <=4.00)

## 2015-10-23 ENCOUNTER — Ambulatory Visit (INDEPENDENT_AMBULATORY_CARE_PROVIDER_SITE_OTHER): Payer: BLUE CROSS/BLUE SHIELD | Admitting: Physician Assistant

## 2015-10-23 ENCOUNTER — Encounter: Payer: Self-pay | Admitting: Physician Assistant

## 2015-10-23 VITALS — BP 104/62 | HR 65 | Temp 98.4°F | Resp 16 | Ht 68.75 in | Wt 183.0 lb

## 2015-10-23 DIAGNOSIS — Z23 Encounter for immunization: Secondary | ICD-10-CM

## 2015-10-23 DIAGNOSIS — Z0000 Encounter for general adult medical examination without abnormal findings: Secondary | ICD-10-CM

## 2015-10-23 MED ORDER — CLARITHROMYCIN 500 MG PO TABS: 500.0000 mg | ORAL_TABLET | Freq: Two times a day (BID) | ORAL | Status: DC

## 2015-10-23 MED ORDER — AMOXICILLIN 500 MG PO CAPS: 1000.0000 mg | ORAL_CAPSULE | Freq: Two times a day (BID) | ORAL | Status: DC

## 2015-10-23 MED ORDER — OMEPRAZOLE 40 MG PO CPDR: 40.0000 mg | DELAYED_RELEASE_CAPSULE | Freq: Two times a day (BID) | ORAL | Status: DC

## 2015-10-23 NOTE — Progress Notes (Signed)
   Subjective:    Patient ID: Dennis Wiggins, male    DOB: [birth year], 54 y.o.   MRN: [id number]  HPI    Review of Systems  Constitutional: Negative.   HENT: Negative.   Eyes: Negative.   Respiratory: Positive for cough.   Cardiovascular: Negative.   Gastrointestinal: Negative.   Endocrine: Negative.   Genitourinary: Negative.   Musculoskeletal: Positive for back pain.  Skin: Negative.   Allergic/Immunologic: Negative.   Neurological: Negative.   Hematological: Negative.   Psychiatric/Behavioral: Negative.        Objective:   Physical Exam        Assessment & Plan:

## 2015-10-23 NOTE — Progress Notes (Signed)
[facility] [address] [place] [address] [phone]  Date:  10/23/2015   Name:  Dennis Wiggins   DOB:  1961-10-01   MRN:  [id number]  PCP:  Reginia Forts, MD    History of Present Illness:  Dennis Wiggins is a 54 y.o. male patient who presents to [facility] for annual physical exam.  He has no complaints or concerns at this time.  -Diet: green vegetables and salads once in a while, beef mostly fish and chicken, not pork.  Water intake: couple cups of water.  Once in awhile sodas.  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  He has been taking the omeprazole.  He reports that his symptoms have improved dramatically.  He states that the heartburn is much better controlled, and he rarely has symptoms.    Patient Active Problem List   Diagnosis Date Noted  . Tobacco use disorder 10/16/2015  . Esophageal reflux 10/16/2015  . 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 Benign neoplasm of colon 05/08/2012    No past medical history on file.  No past surgical history on file.  Social History  Substance Use Topics  . Smoking status: Current Every Day Smoker -- 0.50 packs/day for 13 years    Types: Cigarettes  . Smokeless tobacco: Never Used  . Alcohol Use: None    Family History  Problem Relation Age of Onset  . Cancer Mother     unknown type; had chemotherapy    No Known Allergies  Medication list has been reviewed and updated.  Current Outpatient Prescriptions on File Prior to Visit  Medication Sig Dispense  Refill  . amoxicillin (AMOXIL) 500 MG capsule Take 2 capsules (1,000 mg total) by mouth 2 (two) times daily. 56 capsule 0  . clarithromycin (BIAXIN) 500 MG tablet Take 1 tablet (500 mg total) by mouth 2 (two) times daily. 28 tablet 0  . cyclobenzaprine (FLEXERIL) 10 MG tablet Take 1 tablet (10 mg total) by mouth at bedtime. 30 tablet 0  . meloxicam (MOBIC) 15 MG tablet Take 1 tablet (15 mg total) by mouth daily. Do not use with any other otc pain medication other than tylenol/acetaminophen 30 tablet 0  . omeprazole (PRILOSEC) 40 MG capsule Take 1 capsule (40 mg total) by mouth 2 (two) times daily. 60 capsule 1  . albuterol (PROVENTIL HFA;VENTOLIN HFA) 108 (90 BASE) MCG/ACT inhaler Inhale 2 puffs into the lungs every 4 (four) hours as needed for wheezing or shortness of breath (cough, shortness of breath or wheezing.). (Patient not taking: Reported on 12/12/2014) 1 Inhaler 1   No current facility-administered medications on file prior to visit.    Review of Systems  Constitutional: Negative for fever and chills.  HENT: Negative for ear discharge, ear pain and sore throat.   Eyes: Negative for blurred vision and double vision.  Respiratory: Negative for cough, shortness of breath and wheezing.   Cardiovascular: Positive for chest pain (secondary to heartburn.). Negative for palpitations and leg swelling.  Gastrointestinal: Negative for nausea, vomiting and diarrhea.  Genitourinary:  Negative for dysuria, frequency and hematuria.  Skin: Negative for itching and rash.  Neurological: Negative for dizziness and headaches.   Physical Examination: BP 104/62 mmHg  Pulse 65  Temp(Src) 98.4 F (36.9 C) (Oral)  Resp 16  Ht 5' 8.75" (1.746 m)  Wt 183 lb (83.008 kg)  BMI 27.23 kg/m2 Ideal Body Weight: Weight in (lb) to have BMI = 25: 167.7  Physical Exam  Constitutional: He is oriented to person, place, and time. He appears well-developed and well-nourished. No distress.  HENT:  Head:  Normocephalic and atraumatic.  Right Ear: Tympanic membrane, external ear and ear canal normal.  Left Ear: Tympanic membrane, external ear and ear canal normal.  Eyes: Conjunctivae and EOM are normal. Pupils are equal, round, and reactive to light.  Cardiovascular: Normal rate and regular rhythm.  Exam reveals no friction rub.   No murmur heard. Pulmonary/Chest: Effort normal. No respiratory distress. He has no wheezes.  Abdominal: Soft. Bowel sounds are normal. He exhibits no distension and no mass. There is no tenderness.  Genitourinary:  Declines at this time   Musculoskeletal: Normal range of motion. He exhibits no edema or tenderness.  Neurological: He is alert and oriented to person, place, and time. He displays normal reflexes.  Skin: Skin is warm and dry. He is not diaphoretic.  Psychiatric: He has a normal mood and affect. His behavior is normal.   Results for orders placed or performed in visit on 10/16/15  H. pylori breath test  Result Value Ref Range   H. pylori Breath Test DETECTED (A) Not Detected  Comprehensive metabolic panel  Result Value Ref Range   Sodium 140 135 - 146 mmol/L   Potassium 4.7 3.5 - 5.3 mmol/L   Chloride 104 98 - 110 mmol/L   CO2 26 20 - 31 mmol/L   Glucose, Bld 85 65 - 99 mg/dL   BUN 14 7 - 25 mg/dL   Creat 1.02 0.70 - 1.33 mg/dL   Total Bilirubin 0.6 0.2 - 1.2 mg/dL   Alkaline Phosphatase 68 40 - 115 U/L   AST 20 10 - 35 U/L   ALT 13 9 - 46 U/L   Total Protein 7.3 6.1 - 8.1 g/dL   Albumin 4.3 3.6 - 5.1 g/dL   Calcium 9.7 8.6 - 10.3 mg/dL  PSA  Result Value Ref Range   PSA 1.64 <=4.00 ng/mL  POCT urinalysis dipstick  Result Value Ref Range   Color, UA yellow yellow   Clarity, UA clear clear   Glucose, UA negative negative   Bilirubin, UA negative negative   Ketones, POC UA negative negative   Spec Grav, UA 1.015    Blood, UA trace-lysed (A) negative   pH, UA 7.5    Protein Ur, POC negative negative   Urobilinogen, UA 0.2     Nitrite, UA Negative Negative   Leukocytes, UA Negative Negative  POCT Microscopic Urinalysis ([facility])  Result Value Ref Range   WBC,UR,HPF,POC None None WBC/hpf   RBC,UR,HPF,POC None None RBC/hpf   Bacteria None None, Too numerous to count   Mucus Absent Absent   Epithelial Cells, UR Per Microscopy None None, Too numerous to count cells/hpf  POCT CBC  Result Value Ref Range   WBC 4.9 4.6 - 10.2 K/uL   Lymph, poc 2.1 0.6 - 3.4   POC LYMPH PERCENT 42.8 10 - 50 %L   MID (cbc) 0.4 0 - 0.9   POC MID % 7.8 0 - 12 %M   POC  Granulocyte 2.4 2 - 6.9   Granulocyte percent 49.4 37 - 80 %G   RBC 5.41 4.69 - 6.13 M/uL   Hemoglobin 16.6 14.1 - 18.1 g/dL   HCT, POC 48.5 43.5 - 53.7 %   MCV 89.6 80 - 97 fL   MCH, POC 30.7 27 - 31.2 pg   MCHC 34.3 31.8 - 35.4 g/dL   RDW, POC 13.5 %   Platelet Count, POC 159 142 - 424 K/uL   MPV 8.9 0 - 99.8 fL     Assessment and Plan: Dennis Wiggins is a 54 y.o. male who is here today for annual physical exam.   Discussed start of h pylori infection treatment for 2 weeks, and continued use of the omeprazole.   -labs appear are unremarkable which were ordered for the physical.  PSA normal range, but I have discussed that he will need to have prostate exam at his next annual, but I am relatively unconcerned due to normal lab findings.  Discussed that we did not need to make any changes at this time medicinally.  -advised to increase exercise and water intake.    Annual physical exam - Plan: Tdap vaccine greater than or equal to 7yo IM  Need for Tdap vaccination - Plan: Tdap vaccine greater than or equal to 7yo IM   Ivar Drape, PA-C [facility] 10/23/2015 2:23 PM

## 2015-10-23 NOTE — Patient Instructions (Signed)
Please pick up the medication to treat the h pylori.  This is the clarithromycin, amoxicillin, and the omeprazole (which you already have).  You will follow the prescription directions, and take to completion over the next 2 weeks.  You will continue the omeprazole after the 2 weeks as well.  Make sure you take them as prescribed, as the combination is what resolves h pylori infection.  -Please try to increase your exercise to 4 times per week of exercise.  -Drink 64oz of water per day, which is almost 4 regular sozed water bottles.   Keeping you healthy  Get these tests  Blood pressure- Have your blood pressure checked once a year by your healthcare provider.  Normal blood pressure is 120/80  Weight- Have your body mass index (BMI) calculated to screen for obesity.  BMI is a measure of body fat based on height and weight. You can also calculate your own BMI at ViewBanking.si.  Cholesterol- Have your cholesterol checked every year.  Diabetes- Have your blood sugar checked regularly if you have high blood pressure, high cholesterol, have a family history of diabetes or if you are overweight.  Screening for Colon Cancer- Colonoscopy starting at age 31.  Screening may begin sooner depending on your family history and other health conditions. Follow up colonoscopy as directed by your Gastroenterologist.  Screening for Prostate Cancer- Both blood work (PSA) and a rectal exam help screen for Prostate Cancer.  Screening begins at age 79 with African-American men and at age 51 with Caucasian men.  Screening may begin sooner depending on your family history.  Take these medicines  Aspirin- One aspirin daily can help prevent Heart disease and Stroke.  Flu shot- Every fall.  Tetanus- Every 10 years.  Zostavax- Once after the age of 40 to prevent Shingles.  Pneumonia shot- Once after the age of 108; if you are younger than 40, ask your healthcare provider if you need a Pneumonia  shot.  Take these steps  Don't smoke- If you do smoke, talk to your doctor about quitting.  For tips on how to quit, go to www.smokefree.gov or call 1-800-QUIT-NOW.  Be physically active- Exercise 5 days a week for at least 30 minutes.  If you are not already physically active start slow and gradually work up to 30 minutes of moderate physical activity.  Examples of moderate activity include walking briskly, mowing the yard, dancing, swimming, bicycling, etc.  Eat a healthy diet- Eat a variety of healthy food such as fruits, vegetables, low fat milk, low fat cheese, yogurt, lean meant, poultry, fish, beans, tofu, etc. For more information go to www.thenutritionsource.org  Drink alcohol in moderation- Limit alcohol intake to less than two drinks a day. Never drink and drive.  Dentist- Brush and floss twice daily; visit your dentist twice a year.  Depression- Your emotional health is as important as your physical health. If you're feeling down, or losing interest in things you would normally enjoy please talk to your healthcare provider.  Eye exam- Visit your eye doctor every year.  Safe sex- If you may be exposed to a sexually transmitted infection, use a condom.  Seat belts- Seat belts can save your life; always wear one.  Smoke/Carbon Monoxide detectors- These detectors need to be installed on the appropriate level of your home.  Replace batteries at least once a year.  Skin cancer- When out in the sun, cover up and use sunscreen 15 SPF or higher.  Violence- If anyone is threatening you,  please tell your healthcare provider.  Living Will/ Health care power of attorney- Speak with your healthcare provider and family.

## 2015-11-12 ENCOUNTER — Other Ambulatory Visit: Payer: Self-pay | Admitting: Family Medicine

## 2015-11-13 NOTE — Telephone Encounter (Signed)
OK to RF

## 2015-12-25 ENCOUNTER — Encounter: Payer: BLUE CROSS/BLUE SHIELD | Admitting: Family Medicine

## 2016-01-31 ENCOUNTER — Ambulatory Visit (INDEPENDENT_AMBULATORY_CARE_PROVIDER_SITE_OTHER): Payer: No Typology Code available for payment source

## 2016-01-31 ENCOUNTER — Ambulatory Visit (INDEPENDENT_AMBULATORY_CARE_PROVIDER_SITE_OTHER): Payer: No Typology Code available for payment source | Admitting: Emergency Medicine

## 2016-01-31 VITALS — BP 90/60 | HR 56 | Temp 98.4°F | Resp 16 | Ht 68.75 in | Wt 185.0 lb

## 2016-01-31 DIAGNOSIS — G44039 Episodic paroxysmal hemicrania, not intractable: Secondary | ICD-10-CM

## 2016-01-31 DIAGNOSIS — M5441 Lumbago with sciatica, right side: Secondary | ICD-10-CM | POA: Diagnosis not present

## 2016-01-31 MED ORDER — CYCLOBENZAPRINE HCL 10 MG PO TABS: 10.0000 mg | ORAL_TABLET | Freq: Every day | ORAL | Status: DC

## 2016-01-31 MED ORDER — MELOXICAM 7.5 MG PO TABS: ORAL_TABLET | ORAL | Status: DC

## 2016-01-31 NOTE — Progress Notes (Signed)
Subjective:  This chart was scribed for Dennis Jordan MD, by Tamsen Roers, at [facility].  This patient was seen in room  11 and the patient's care was started at 8:50 AM.   Chief Complaint  Patient presents with  . Headache    "past few days"  . Back Pain    Middle and lower, X 1 month     Patient ID: Dennis Wiggins, male    DOB: 22-Feb-1961, 55 y.o.   MRN: [id number]  HPI HPI Comments: Dennis Wiggins is a 55 y.o. male who presents to the [facility] complaining of a headache as well as back pain.   Headache: Patient states that he has been having a headache onset four days ago.  He was not able to go to work on Monday and feels it especially when he bends his head forwards.  He denies any vomiting or double vision.  He has taken ibuprofen but denies any permanent relief.  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   Lower and upper back pain: Patient is unsure of what caused his back pain but states that he bends quite often at work.  His pain comes on mainly when he bends over and states that work was difficult yesterday.  He denies any urinary symptoms or numbness but has some associated symptoms of weakness/tiredness in the mornings in his legs.      Patient Active Problem List   Diagnosis Date Noted  . Tobacco use disorder 10/16/2015  . Esophageal reflux 10/16/2015  . 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 No past surgical history on file. No Known Allergies Prior to Admission medications   Medication Sig Start Date End Date Taking? Authorizing Provider  albuterol (PROVENTIL HFA;VENTOLIN HFA) 108 (90 BASE) MCG/ACT inhaler Inhale 2 puffs into the lungs every 4 (four) hours as needed for wheezing or shortness of breath (cough, shortness of breath or wheezing.). Patient not taking: Reported on 12/12/2014 09/19/14   Harrison Mons, PA-C  amoxicillin  (AMOXIL) 500 MG capsule Take 2 capsules (1,000 mg total) by mouth 2 (two) times daily. Patient not taking: Reported on 01/31/2016 10/23/15   Shawnee Knapp, MD  clarithromycin (BIAXIN) 500 MG tablet Take 1 tablet (500 mg total) by mouth 2 (two) times daily. Patient not taking: Reported on 01/31/2016 10/23/15   Shawnee Knapp, MD  cyclobenzaprine (FLEXERIL) 10 MG tablet Take 1 tablet (10 mg total) by mouth at bedtime. Patient not taking: Reported on 01/31/2016 10/16/15   Shawnee Knapp, MD  meloxicam (MOBIC) 7.5 MG tablet Take 1-2 tab by mouth daily as needed for pain. Do NOT use with any other OTC pain meds other than acetaminophen. Patient not taking: Reported on 01/31/2016 11/16/15   Shawnee Knapp, MD  omeprazole (PRILOSEC) 40 MG capsule Take 1 capsule (40 mg total) by mouth 2 (two) times daily. Patient not taking: Reported on 01/31/2016 10/23/15   Shawnee Knapp, MD   Social History   Social History  . Marital Status: Single    Spouse Name: N/A  . Number of Children: 4  . Years of Education: 12th grade   Occupational History  . vault caster    Social History Main Topics  . Smoking status: Current Every Day Smoker -- 0.50 packs/day for 13 years    Types: Cigarettes  . Smokeless tobacco: Never Used  .  Alcohol Use: Not on file  . Drug Use: Not on file  . Sexual Activity: Not on file   Other Topics Concern  . Not on file   Social History Narrative   From [place]. Came to the [place] in 2003.     Review of Systems  Constitutional: Negative for fever and chills.  Eyes: Negative for pain, redness, itching and visual disturbance.  Respiratory: Negative for cough, choking and shortness of breath.   Gastrointestinal: Negative for nausea and vomiting.  Genitourinary: Negative for urgency, frequency and difficulty urinating.  Musculoskeletal: Positive for back pain. Negative for neck pain and neck stiffness.  Neurological: Positive for weakness and headaches. Negative for seizures, syncope, speech difficulty and  numbness.       Objective:   Physical Exam Filed Vitals:   01/31/16 0835  BP: 90/60  Pulse: 56  Temp: 98.4 F (36.9 C)  TempSrc: Oral  Resp: 16  Height: 5' 8.75" (1.746 m)  Weight: 185 lb (83.915 kg)  SpO2: 98%    CONSTITUTIONAL: Well developed/well nourished, alert and cooperative.  HEAD: Normocephalic/atraumatic EYES:Right exotropia. SPINE/BACK:entire spine nontender CV: S1/S2 noted, no murmurs/rubs/gallops noted LUNGS: Lungs are clear to auscultation bilaterally, no apparent distress ABDOMEN: soft, nontender, no rebound or guarding, bowel sounds noted throughout abdomen GU:no cva tenderness NEURO: Pt is awake/alert/appropriate, moves all extremitiesx4.  No facial droop.  Cranial nevers in tact EXTREMITIES: No real tenderness over the lower lumbar spine, straight leg raise was negative and he has no weakness.  SKIN: warm, color normal PSYCH: no abnormalities of mood noted, alert and oriented to situation   Dg Lumbar Spine Complete  01/31/2016  CLINICAL DATA:  Upper and lower back pain, no acute injury EXAM: LUMBAR SPINE - COMPLETE 4+ VIEW COMPARISON:  None. FINDINGS: The lumbar vertebrae are in normal alignment. Intervertebral disc spaces appear normal. No compression deformity is seen. On oblique views the facet joints are unremarkable and no pars defect is seen. The SI joints are corticated. IMPRESSION: Normal alignment.  Normal intervertebral disc spaces. Electronically Signed   By: Ivar Drape M.D.   On: 01/31/2016 09:27          Assessment & Plan:  Will treat patient with Modic 1-2 a day along with Flexeril at night. X-ray the back is unremarkable. I will go ahead and schedule a CT of his head because of his right sided headache. Hopefully the anti-inflammatories will give him some relief.I personally performed the services described in this documentation, which was scribed in my presence. The recorded information has been reviewed and is accurate. Darlyne Russian,  MD

## 2016-01-31 NOTE — Patient Instructions (Addendum)
Because you received an x-ray today, you will receive an invoice from [facility]. Please contact [facility] at [phone] with questions or concerns regarding your invoice. Our billing staff will not be able to assist you with those questions. Lumbosacral Strain Lumbosacral strain is a strain of any of the parts that make up your lumbosacral vertebrae. Your lumbosacral vertebrae are the bones that make up the lower third of your backbone. Your lumbosacral vertebrae are held together by muscles and tough, fibrous tissue (ligaments).  CAUSES  A sudden blow to your back can cause lumbosacral strain. Also, anything that causes an excessive stretch of the muscles in the low back can cause this strain. This is typically seen when people exert themselves strenuously, fall, lift heavy objects, bend, or crouch repeatedly. RISK FACTORS  Physically demanding work.  Participation in pushing or pulling sports or sports that require a sudden twist of the back (tennis, golf, baseball).  Weight lifting.  Excessive lower back curvature.  Forward-tilted pelvis.  Weak back or abdominal muscles or both.  Tight hamstrings. SIGNS AND SYMPTOMS  Lumbosacral strain may cause pain in the area of your injury or pain that moves (radiates) down your leg.  DIAGNOSIS Your health care provider can often diagnose lumbosacral strain through a physical exam. In some cases, you may need tests such as X-ray exams.  TREATMENT  Treatment for your lower back injury depends on many factors that your clinician will have to evaluate. However, most treatment will include the use of anti-inflammatory medicines. HOME CARE INSTRUCTIONS   Avoid hard physical activities (tennis, racquetball, waterskiing) if you are not in proper physical condition for it. This may aggravate or create problems.  If you have a back problem, avoid sports requiring sudden body movements. Swimming and walking are generally safer  activities.  Maintain good posture.  Maintain a healthy weight.  For acute conditions, you may put ice on the injured area.  Put ice in a plastic bag.  Place a towel between your skin and the bag.  Leave the ice on for 20 minutes, 2-3 times a day.  When the low back starts healing, stretching and strengthening exercises may be recommended. SEEK MEDICAL CARE IF:  Your back pain is getting worse.  You experience severe back pain not relieved with medicines. SEEK IMMEDIATE MEDICAL CARE IF:   You have numbness, tingling, weakness, or problems with the use of your arms or legs.  There is a change in bowel or bladder control.  You have increasing pain in any area of the body, including your belly (abdomen).  You notice shortness of breath, dizziness, or feel faint.  You feel sick to your stomach (nauseous), are throwing up (vomiting), or become sweaty.  You notice discoloration of your toes or legs, or your feet get very cold. MAKE SURE YOU:   Understand these instructions.  Will watch your condition.  Will get help right away if you are not doing well or get worse.   This information is not intended to replace advice given to you by your health care provider. Make sure you discuss any questions you have with your health care provider.   Document Released: 08/28/2005 Document Revised: 12/09/2014 Document Reviewed: 07/07/2013 Elsevier Interactive Patient Education 2016 Elsevier Inc. Tension Headache A tension headache is a feeling of pain, pressure, or aching that is often felt over the front and sides of the head. The pain can be dull, or it can feel tight (constricting). Tension headaches are not normally  associated with nausea or vomiting, and they do not get worse with physical activity. Tension headaches can last from 30 minutes to several days. This is the most common type of headache. CAUSES The exact cause of this condition is not known. Tension headaches often begin  after stress, anxiety, or depression. Other triggers may include:  Alcohol.  Too much caffeine, or caffeine withdrawal.  Respiratory infections, such as colds, flu, or sinus infections.  Dental problems or teeth clenching.  Fatigue.  Holding your head and neck in the same position for a long period of time, such as while using a computer.  Smoking. SYMPTOMS Symptoms of this condition include:  A feeling of pressure around the head.  Dull, aching head pain.  Pain felt over the front and sides of the head.  Tenderness in the muscles of the head, neck, and shoulders. DIAGNOSIS This condition may be diagnosed based on your symptoms and a physical exam. Tests may be done, such as a CT scan or an MRI of your head. These tests may be done if your symptoms are severe or unusual. TREATMENT This condition may be treated with lifestyle changes and medicines to help relieve symptoms. HOME CARE INSTRUCTIONS Managing Pain  Take over-the-counter and prescription medicines only as told by your health care provider.  Lie down in a dark, quiet room when you have a headache.  If directed, apply ice to the head and neck area:  Put ice in a plastic bag.  Place a towel between your skin and the bag.  Leave the ice on for 20 minutes, 2-3 times per day.  Use a heating pad or a hot shower to apply heat to the head and neck area as told by your health care provider. Eating and Drinking  Eat meals on a regular schedule.  Limit alcohol use.  Decrease your caffeine intake, or stop using caffeine. General Instructions  Keep all follow-up visits as told by your health care provider. This is important.  Keep a headache journal to help find out what may trigger your headaches. For example, write down:  What you eat and drink.  How much sleep you get.  Any change to your diet or medicines.  Try massage or other relaxation techniques.  Limit stress.  Sit up straight, and avoid  tensing your muscles.  Do not use tobacco products, including cigarettes, chewing tobacco, or e-cigarettes. If you need help quitting, ask your health care provider.  Exercise regularly as told by your health care provider.  Get 7-9 hours of sleep, or the amount recommended by your health care provider. SEEK MEDICAL CARE IF:  Your symptoms are not helped by medicine.  You have a headache that is different from what you normally experience.  You have nausea or you vomit.  You have a fever. SEEK IMMEDIATE MEDICAL CARE IF:  Your headache becomes severe.  You have repeated vomiting.  You have a stiff neck.  You have a loss of vision.  You have problems with speech.  You have pain in your eye or ear.  You have muscular weakness or loss of muscle control.  You lose your balance or you have trouble walking.  You feel faint or you pass out.  You have confusion.   This information is not intended to replace advice given to you by your health care provider. Make sure you discuss any questions you have with your health care provider.   Document Released: 11/18/2005 Document Revised: 08/09/2015 Document Reviewed: 03/13/2015  Chartered certified accountant Patient Education [employer].

## 2016-02-13 ENCOUNTER — Ambulatory Visit (INDEPENDENT_AMBULATORY_CARE_PROVIDER_SITE_OTHER): Payer: No Typology Code available for payment source | Admitting: Family Medicine

## 2016-02-13 VITALS — BP 100/68 | HR 73 | Temp 98.0°F | Resp 16 | Ht 68.0 in | Wt 183.4 lb

## 2016-02-13 DIAGNOSIS — R0789 Other chest pain: Secondary | ICD-10-CM | POA: Diagnosis not present

## 2016-02-13 NOTE — Assessment & Plan Note (Signed)
Very atypical chest pain as not substernal pressure, radiating pain, or worsening with activity/relieved by rest.  Highly doubt this is cardiac with Heart score of 2 and TIMI of 1.  With hx of reflux, would favor this as he does state it improved with water.  If continues, would further explore cardiac w/u with stress testing and cardiac evaluation.  If has reoccurrence or worsening of pain, recommend eval by ED with CP r/o including troponin cycle.  EKG appears nml w/o ST elevation or TWI.

## 2016-02-13 NOTE — Patient Instructions (Addendum)
You can pick up Nexium or Prilosec over the counter for heartburn. This should reduce your symptoms.  Heartburn Heartburn is a type of pain or discomfort that can happen in the throat or chest. It is often described as a burning pain. It may also cause a bad taste in the mouth. Heartburn may feel worse when you lie down or bend over, and it is often worse at night. Heartburn may be caused by stomach contents that move back up into the esophagus (reflux). HOME CARE INSTRUCTIONS Take these actions to decrease your discomfort and to help avoid complications. Diet  Follow a diet as recommended by your health care provider. This may involve avoiding foods and drinks such as:  Coffee and tea (with or without caffeine).  Drinks that contain alcohol.  Energy drinks and sports drinks.  Carbonated drinks or sodas.  Chocolate and cocoa.  Peppermint and mint flavorings.  Garlic and onions.  Horseradish.  Spicy and acidic foods, including peppers, chili powder, curry powder, vinegar, hot sauces, and barbecue sauce.  Citrus fruit juices and citrus fruits, such as oranges, lemons, and limes.  Tomato-based foods, such as red sauce, chili, salsa, and pizza with red sauce.  Fried and fatty foods, such as donuts, french fries, potato chips, and high-fat dressings.  High-fat meats, such as hot dogs and fatty cuts of red and white meats, such as rib eye steak, sausage, ham, and bacon.  High-fat dairy items, such as whole milk, butter, and cream cheese.  Eat small, frequent meals instead of large meals.  Avoid drinking large amounts of liquid with your meals.  Avoid eating meals during the 2-3 hours before bedtime.  Avoid lying down right after you eat.  Do not exercise right after you eat. General Instructions  Pay attention to any changes in your symptoms.  Take over-the-counter and prescription medicines only as told by your health care provider. Do not take aspirin, ibuprofen, or  other NSAIDs unless your health care provider told you to do so.  Do not use any tobacco products, including cigarettes, chewing tobacco, and e-cigarettes. If you need help quitting, ask your health care provider.  Wear loose-fitting clothing. Do not wear anything tight around your waist that causes pressure on your abdomen.  Raise (elevate) the head of your bed about 6 inches (15 cm).  Try to reduce your stress, such as with yoga or meditation. If you need help reducing stress, ask your health care provider.  If you are overweight, reduce your weight to an amount that is healthy for you. Ask your health care provider for guidance about a safe weight loss goal.  Keep all follow-up visits as told by your health care provider. This is important. SEEK MEDICAL CARE IF:  You have new symptoms.  You have unexplained weight loss.  You have difficulty swallowing, or it hurts to swallow.  You have wheezing or a persistent cough.  Your symptoms do not improve with treatment.  You have frequent heartburn for more than two weeks. SEEK IMMEDIATE MEDICAL CARE IF:  You have pain in your arms, neck, jaw, teeth, or back.  You feel sweaty, dizzy, or light-headed.  You have chest pain or shortness of breath.  You vomit and your vomit looks like blood or coffee grounds.  Your stool is bloody or black.   This information is not intended to replace advice given to you by your health care provider. Make sure you discuss any questions you have with your health care provider.  Document Released: 04/06/2009 Document Revised: 08/09/2015 Document Reviewed: 03/15/2015 Elsevier Interactive Patient Education 2016 [employer].   IF you received an x-ray today, you will receive an invoice from [facility]. Please contact [facility]iology at [phone] with questions or concerns regarding your invoice.   IF you received labwork today, you will receive an invoice from [employer]. Please contact [facility] at [phone] with questions or concerns regarding your invoice.   Our billing staff will not be able to assist you with questions regarding bills from these companies.  You will be contacted with the lab results as soon as they are available. The fastest way to get your results is to activate your My Chart account. Instructions are located on the last page of this paperwork. If you have not heard from [place] regarding the results in 2 weeks, please contact this office.

## 2016-02-13 NOTE — Progress Notes (Signed)
Dennis Wiggins is a 55 y.o. male who presents today for chest pain.   Chest pain - Midline chest pain this AM that began at 10:30 AM, sharp in nature and lasted 5 minutes and was relieved by drinking water.  Denies any radiating signs down his L arm, into his jaw, or into his periscapular region.  Pain was not made worse by walking or activity.  It was not relieved by rest.  No previous problems per his report and denies previous cardiac history or early family hx of cardiac problems.  Describes a burning sensation that does get worse with laying down.    History reviewed. No pertinent past medical history.  History  Smoking status  . Current Every Day Smoker -- 0.50 packs/day for 13 years  . Types: Cigarettes  Smokeless tobacco  . Never Used    Family History  Problem Relation Age of Onset  . Cancer Mother     unknown type; had chemotherapy    Current Outpatient Prescriptions on File Prior to Visit  Medication Sig Dispense Refill  . cyclobenzaprine (FLEXERIL) 10 MG tablet Take 1 tablet (10 mg total) by mouth at bedtime. 30 tablet 0  . meloxicam (MOBIC) 7.5 MG tablet Take 1-2 tab by mouth daily as needed for pain. Do NOT use with any other OTC pain meds other than acetaminophen. 60 tablet 1  . albuterol (PROVENTIL HFA;VENTOLIN HFA) 108 (90 BASE) MCG/ACT inhaler Inhale 2 puffs into the lungs every 4 (four) hours as needed for wheezing or shortness of breath (cough, shortness of breath or wheezing.). (Patient not taking: Reported on 12/12/2014) 1 Inhaler 1  . clarithromycin (BIAXIN) 500 MG tablet Take 1 tablet (500 mg total) by mouth 2 (two) times daily. (Patient not taking: Reported on 01/31/2016) 28 tablet 0  . omeprazole (PRILOSEC) 40 MG capsule Take 1 capsule (40 mg total) by mouth 2 (two) times daily. (Patient not taking: Reported on 01/31/2016) 60 capsule 1   No current facility-administered medications on file prior to visit.    ROS: Per HPI.  All other systems reviewed and are  negative.   Physical Exam Filed Vitals:   02/13/16 1743  BP: 100/68  Pulse: 73  Temp: 98 F (36.7 C)  Resp: 16    Physical Examination: General appearance - alert, well appearing, and in no distress Mental status - alert, oriented to person, place, and time Neck - carotids upstroke normal bilaterally, no bruits, no JVD Chest - clear to auscultation, no wheezes, rales or rhonchi, symmetric air entry Heart - normal rate and regular rhythm    Chemistry      Component Value Date/Time   NA 140 10/16/2015 1046   K 4.7 10/16/2015 1046   CL 104 10/16/2015 1046   CO2 26 10/16/2015 1046   BUN 14 10/16/2015 1046   CREATININE 1.02 10/16/2015 1046   CREATININE 1.1 06/08/2007 1226      Component Value Date/Time   CALCIUM 9.7 10/16/2015 1046   ALKPHOS 68 10/16/2015 1046   AST 20 10/16/2015 1046   ALT 13 10/16/2015 1046   BILITOT 0.6 10/16/2015 1046      Lab Results  Component Value Date   WBC 4.9 10/16/2015   HGB 16.6 10/16/2015   HCT 48.5 10/16/2015   MCV 89.6 10/16/2015   Lab Results  Component Value Date   TSH 0.930 02/18/2012   No results found for: HGBA1C

## 2016-09-05 ENCOUNTER — Ambulatory Visit (INDEPENDENT_AMBULATORY_CARE_PROVIDER_SITE_OTHER): Payer: No Typology Code available for payment source | Admitting: Physician Assistant

## 2016-09-05 VITALS — BP 112/76 | HR 68 | Temp 98.4°F | Resp 18 | Ht 68.0 in | Wt 178.0 lb

## 2016-09-05 DIAGNOSIS — M545 Low back pain, unspecified: Secondary | ICD-10-CM

## 2016-09-05 MED ORDER — CYCLOBENZAPRINE HCL 10 MG PO TABS: ORAL_TABLET | ORAL | 0 refills | Status: DC

## 2016-09-05 MED ORDER — MELOXICAM 15 MG PO TABS: 15.0000 mg | ORAL_TABLET | Freq: Every day | ORAL | 0 refills | Status: DC

## 2016-09-05 NOTE — Patient Instructions (Signed)
     IF you received an x-ray today, you will receive an invoice from [facility]. Please contact [facility] at [phone] with questions or concerns regarding your invoice.   IF you received labwork today, you will receive an invoice from [facility]. Please contact [facility] at [phone] with questions or concerns regarding your invoice.   Our billing staff will not be able to assist you with questions regarding bills from these companies.  You will be contacted with the lab results as soon as they are available. The fastest way to get your results is to activate your My Chart account. Instructions are located on the last page of this paperwork. If you have not heard from us regarding the results in 2 weeks, please contact this office.      

## 2016-09-05 NOTE — Progress Notes (Signed)
   09/05/2016 2:15 PM   DOB: Mar 08, 1961 / MRN: [id number]  SUBJECTIVE:  Dennis Wiggins is a 55 y.o. male presenting for left sided "hip pain" as he points to his left piriformis.  States that the pain has been present for about 1.5 months now.  erports that the pain radiates to left sided hip proper and goes down to the upper left thigh.  He denies weakness and paresthesia.  Bending over makes the pain worse.  He has not tried any medication for the pain.  He is actively smoking.    He has No Known Allergies.   He  has no past medical history on file.    He  reports that he has been smoking Cigarettes.  He has a 6.50 pack-year smoking history. He has never used smokeless tobacco. He  has no sexual activity history on file. The patient  has no past surgical history on file.  His family history includes Cancer in his mother.  Review of Systems  Constitutional: Negative for fever.  Respiratory: Negative for cough.   Gastrointestinal: Negative for constipation and diarrhea.  Genitourinary: Negative for dysuria, frequency and urgency.  Musculoskeletal: Positive for back pain, joint pain and myalgias.  Neurological: Negative for dizziness, tingling, tremors, sensory change, speech change, focal weakness, seizures and loss of consciousness.    The problem list and medications were reviewed and updated by myself where necessary and exist elsewhere in the encounter.   OBJECTIVE:  BP 112/76 (BP Location: Right Arm, Patient Position: Sitting, Cuff Size: Small)   Pulse 68   Temp 98.4 F (36.9 C) (Oral)   Resp 18   Ht 5\' 8"  (1.727 m)   Wt 178 lb (80.7 kg)   SpO2 97%   BMI 27.06 kg/m   Physical Exam  Constitutional: He is oriented to person, place, and time.  Cardiovascular: Normal rate, regular rhythm and normal heart sounds.   Pulmonary/Chest: Effort normal and breath sounds normal. No respiratory distress. He has no wheezes. He has no rales. He exhibits no tenderness.  Musculoskeletal:  Normal range of motion.       Lumbar back: He exhibits tenderness, pain and spasm. He exhibits normal range of motion, no bony tenderness, no swelling, no edema, no deformity, no laceration and normal pulse.  Neurological: He is alert and oriented to person, place, and time. He displays normal reflexes. No cranial nerve deficit. He exhibits normal muscle tone. Coordination normal.  Skin: Skin is warm and dry.  Vitals reviewed.   No results found for this or any previous visit (from the past 72 hour(s)).  No results found.  ASSESSMENT AND PLAN  [name] was seen today for hip pain.  Diagnoses and all orders for this visit:  Acute left-sided low back pain without sciatica: No red flags.  Will treat, RTC for imaging in the next week or two if not improving.  -     meloxicam (MOBIC) 15 MG tablet; Take 1 tablet (15 mg total) by mouth daily. -     cyclobenzaprine (FLEXERIL) 10 MG tablet; Take 5-10 mg every 8 hours as needed for back pain    The patient is advised to call or return to clinic if he does not see an improvement in symptoms, or to seek the care of the closest emergency department if he worsens with the above plan.   Philis Fendt, MHS, PA-C [facility] 09/05/2016 2:15 PM

## 2017-01-10 ENCOUNTER — Ambulatory Visit (INDEPENDENT_AMBULATORY_CARE_PROVIDER_SITE_OTHER): Payer: Self-pay | Admitting: Emergency Medicine

## 2017-01-10 VITALS — BP 120/64 | HR 60 | Temp 98.0°F | Ht 68.0 in | Wt 184.2 lb

## 2017-01-10 DIAGNOSIS — H5010 Unspecified exotropia: Secondary | ICD-10-CM

## 2017-01-10 DIAGNOSIS — Z0000 Encounter for general adult medical examination without abnormal findings: Secondary | ICD-10-CM

## 2017-01-10 NOTE — Patient Instructions (Signed)
Health Maintenance, Male A healthy lifestyle and preventative care can promote health and wellness.  Maintain regular health, dental, and eye exams.  Eat a healthy diet. Foods like vegetables, fruits, whole grains, low-fat dairy products, and lean protein foods contain the nutrients you need and are low in calories. Decrease your intake of foods high in solid fats, added sugars, and salt. Get information about a proper diet from your health care provider, if necessary.  Regular physical exercise is one of the most important things you can do for your health. Most adults should get at least 150 minutes of moderate-intensity exercise (any activity that increases your heart rate and causes you to sweat) each week. In addition, most adults need muscle-strengthening exercises on 2 or more days a week.   Maintain a healthy weight. The body mass index (BMI) is a screening tool to identify possible weight problems. It provides an estimate of body fat based on height and weight. Your health care provider can find your BMI and can help you achieve or maintain a healthy weight. For males 20 years and older:  A BMI below 18.5 is considered underweight.  A BMI of 18.5 to 24.9 is normal.  A BMI of 25 to 29.9 is considered overweight.  A BMI of 30 and above is considered obese.  Maintain normal blood lipids and cholesterol by exercising and minimizing your intake of saturated fat. Eat a balanced diet with plenty of fruits and vegetables. Blood tests for lipids and cholesterol should begin at age 22 and be repeated every 5 years. If your lipid or cholesterol levels are high, you are over age 58, or you are at high risk for heart disease, you may need your cholesterol levels checked more frequently.Ongoing high lipid and cholesterol levels should be treated with medicines if diet and exercise are not working.  If you smoke, find out from your health care provider how to quit. If you do not use tobacco, do  not start.  Lung cancer screening is recommended for adults aged 17-80 years who are at high risk for developing lung cancer because of a history of smoking. A yearly low-dose CT scan of the lungs is recommended for people who have at least a 30-pack-year history of smoking and are current smokers or have quit within the past 15 years. A pack year of smoking is smoking an average of 1 pack of cigarettes a day for 1 year (for example, a 30-pack-year history of smoking could mean smoking 1 pack a day for 30 years or 2 packs a day for 15 years). Yearly screening should continue until the smoker has stopped smoking for at least 15 years. Yearly screening should be stopped for people who develop a health problem that would prevent them from having lung cancer treatment.  If you choose to drink alcohol, do not have more than 2 drinks per day. One drink is considered to be 12 oz (360 mL) of beer, 5 oz (150 mL) of wine, or 1.5 oz (45 mL) of liquor.  Avoid the use of street drugs. Do not share needles with anyone. Ask for help if you need support or instructions about stopping the use of drugs.  High blood pressure causes heart disease and increases the risk of stroke. High blood pressure is more likely to develop in:  People who have blood pressure in the end of the normal range (100-139/85-89 mm Hg).  People who are overweight or obese.  People who are African American.  If you are 63-19 years of age, have your blood pressure checked every 3-5 years. If you are 69 years of age or older, have your blood pressure checked every year. You should have your blood pressure measured twice-once when you are at a hospital or clinic, and once when you are not at a hospital or clinic. Record the average of the two measurements. To check your blood pressure when you are not at a hospital or clinic, you can use:  An automated blood pressure machine at a pharmacy.  A home blood pressure monitor.  If you are 28-83  years old, ask your health care provider if you should take aspirin to prevent heart disease.  Diabetes screening involves taking a blood sample to check your fasting blood sugar level. This should be done once every 3 years after age 54 if you are at a normal weight and without risk factors for diabetes. Testing should be considered at a younger age or be carried out more frequently if you are overweight and have at least 1 risk factor for diabetes.  Colorectal cancer can be detected and often prevented. Most routine colorectal cancer screening begins at the age of 78 and continues through age 35. However, your health care provider may recommend screening at an earlier age if you have risk factors for colon cancer. On a yearly basis, your health care provider may provide home test kits to check for hidden blood in the stool. A small camera at the end of a tube may be used to directly examine the colon (sigmoidoscopy or colonoscopy) to detect the earliest forms of colorectal cancer. Talk to your health care provider about this at age 22 when routine screening begins. A direct exam of the colon should be repeated every 5-10 years through age 63, unless early forms of precancerous polyps or small growths are found.  People who are at an increased risk for hepatitis B should be screened for this virus. You are considered at high risk for hepatitis B if:  You were born in a country where hepatitis B occurs often. Talk with your health care provider about which countries are considered high risk.  Your parents were born in a high-risk country and you have not received a shot to protect against hepatitis B (hepatitis B vaccine).  You have HIV or AIDS.  You use needles to inject street drugs.  You live with, or have sex with, someone who has hepatitis B.  You are a man who has sex with other men (MSM).  You get hemodialysis treatment.  You take certain medicines for conditions like cancer, organ  transplantation, and autoimmune conditions.  Hepatitis C blood testing is recommended for all people born from [birth year] through 1966 and any individual with known risk factors for hepatitis C.  Healthy men should no longer receive prostate-specific antigen (PSA) blood tests as part of routine cancer screening. Talk to your health care provider about prostate cancer screening.  Testicular cancer screening is not recommended for adolescents or adult males who have no symptoms. Screening includes self-exam, a health care provider exam, and other screening tests. Consult with your health care provider about any symptoms you have or any concerns you have about testicular cancer.  Practice safe sex. Use condoms and avoid high-risk sexual practices to reduce the spread of sexually transmitted infections (STIs).  You should be screened for STIs, including gonorrhea and chlamydia if:  You are sexually active and are younger than 24 years.  You  are older than 24 years, and your health care provider tells you that you are at risk for this type of infection.  Your sexual activity has changed since you were last screened, and you are at an increased risk for chlamydia or gonorrhea. Ask your health care provider if you are at risk.  If you are at risk of being infected with HIV, it is recommended that you take a prescription medicine daily to prevent HIV infection. This is called pre-exposure prophylaxis (PrEP). You are considered at risk if:  You are a man who has sex with other men (MSM).  You are a heterosexual man who is sexually active with multiple partners.  You take drugs by injection.  You are sexually active with a partner who has HIV.  Talk with your health care provider about whether you are at high risk of being infected with HIV. If you choose to begin PrEP, you should first be tested for HIV. You should then be tested every 3 months for as long as you are taking PrEP.  Use sunscreen. Apply  sunscreen liberally and repeatedly throughout the day. You should seek shade when your shadow is shorter than you. Protect yourself by wearing long sleeves, pants, a wide-brimmed hat, and sunglasses year round whenever you are outdoors.  Tell your health care provider of new moles or changes in moles, especially if there is a change in shape or color. Also, tell your health care provider if a mole is larger than the size of a pencil eraser.  A one-time screening for abdominal aortic aneurysm (AAA) and surgical repair of large AAAs by ultrasound is recommended for men aged 73-75 years who are current or former smokers.  Stay current with your vaccines (immunizations). This information is not intended to replace advice given to you by your health care provider. Make sure you discuss any questions you have with your health care provider. Document Released: 05/16/2008 Document Revised: 12/09/2014 Document Reviewed: 08/22/2015 Elsevier Interactive Patient Education  2017 [facility]) Exercise Recommendation  Being physically active is important to prevent heart disease and stroke, the nation's No. 1and No. 5killers. To improve overall cardiovascular health, we suggest at least 150 minutes per week of moderate exercise or 75 minutes per week of vigorous exercise (or a combination of moderate and vigorous activity). Thirty minutes a day, five times a week is an easy goal to remember. You will also experience benefits even if you divide your time into two or three segments of 10 to 15 minutes per day.  For people who would benefit from lowering their blood pressure or cholesterol, we recommend 40 minutes of aerobic exercise of moderate to vigorous intensity three to four times a week to lower the risk for heart attack and stroke.  Physical activity is anything that makes you move your body and burn calories.  This includes things like climbing stairs or playing sports.  Aerobic exercises benefit your heart, and include walking, jogging, swimming or biking. Strength and stretching exercises are best for overall stamina and flexibility.  The simplest, positive change you can make to effectively improve your heart health is to start walking. It's enjoyable, free, easy, social and great exercise. A walking program is flexible and boasts high success rates because people can stick with it. It's easy for walking to become a regular and satisfying part of life.   For Overall Cardiovascular Health:  At least 30 minutes of moderate-intensity aerobic activity at least  5 days per week for a total of 150  OR   At least 25 minutes of vigorous aerobic activity at least 3 days per week for a total of 75 minutes; or a combination of moderate- and vigorous-intensity aerobic activity  AND   Moderate- to high-intensity muscle-strengthening activity at least 2 days per week for additional health benefits.  For Lowering Blood Pressure and Cholesterol  An average 40 minutes of moderate- to vigorous-intensity aerobic activity 3 or 4 times per week  What if I can't make it to the time goal? Something is always better than nothing! And everyone has to start somewhere. Even if you've been sedentary for years, today is the day you can begin to make healthy changes in your life. If you don't think you'll make it for 30 or 40 minutes, set a reachable goal for today. You can work up toward your overall goal by increasing your time as you get stronger. Don't let all-or-nothing thinking rob you of doing what you can every day.  Source:http://www.heart.org    

## 2017-01-10 NOTE — Progress Notes (Signed)
Patient ID: Dennis Wiggins, male   DOB: 1961/11/28, 56 y.o.   MRN: [id number] Dennis Wiggins 56 y.o.   Chief Complaint  Patient presents with  . Annual Exam    CPE    HISTORY OF PRESENT ILLNESS: This is a 56 y.o. male here for annual exam; no complaints.  HPI   Prior to Admission medications   Medication Sig Start Date End Date Taking? Authorizing Provider  albuterol (PROVENTIL HFA;VENTOLIN HFA) 108 (90 BASE) MCG/ACT inhaler Inhale 2 puffs into the lungs every 4 (four) hours as needed for wheezing or shortness of breath (cough, shortness of breath or wheezing.). 09/19/14  Yes Chelle Jeffery, PA-C  cyclobenzaprine (FLEXERIL) 10 MG tablet Take 5-10 mg every 8 hours as needed for back pain Patient not taking: Reported on 01/10/2017 09/05/16   Tereasa Coop, PA-C  meloxicam (MOBIC) 15 MG tablet Take 1 tablet (15 mg total) by mouth daily. Patient not taking: Reported on 01/10/2017 09/05/16   Tereasa Coop, PA-C    No Known Allergies  Patient Active Problem List   Diagnosis Date Noted  . Tobacco use disorder 10/16/2015  . Esophageal reflux 10/16/2015  . 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 Benign neoplasm of colon 05/08/2012    History reviewed. No pertinent past medical history.  History reviewed. No pertinent surgical history.  Social History   Social History  . Marital status: Single    Spouse name: N/A  . Number of children: 4  . Years of education: 12th grade   Occupational History  . 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 Smoking status: Current Every Day Smoker    Packs/day: 0.50    Years: 13.00    Types: Cigarettes  . Smokeless tobacco: Never Used  . Alcohol use 1.8 oz/week    1 Glasses of wine, 1 Cans of beer, 1 Shots of liquor per week  . Drug use: No  . Sexual activity: Not on file   Other Topics Concern  . Not on file   Social History Narrative   From [place]. Came to the [place] in 2003.    Family History  Problem Relation Age of Onset   . Cancer Mother     unknown type; had chemotherapy     Review of Systems  Constitutional: Negative.   HENT: Negative.   Eyes: Negative.        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  Respiratory: Negative.   Cardiovascular: Negative.   Gastrointestinal: Negative.   Genitourinary: Negative.   Musculoskeletal: Negative.   Skin: Negative.   Endo/Heme/Allergies: Negative.   Psychiatric/Behavioral: Negative.   All other systems reviewed and are negative.   Vitals:   01/10/17 0856  BP: 120/64  Pulse: 60  Temp: 98 F (36.7 C)    Physical Exam  Constitutional: He is oriented to person, place, and time. He appears well-developed and well-nourished.  HENT:  Head: Normocephalic and atraumatic.  Right Ear: External ear normal.  Left Ear: External ear normal.  Nose: Nose normal.  Mouth/Throat: Oropharynx is clear and moist. No oropharyngeal exudate.  Eyes: Conjunctivae and EOM are normal. Pupils are equal, round, and reactive to light.  Right eye with lateral deviation (congenital)  Neck: Normal range of motion. Neck supple. No JVD present. Carotid bruit is not present. No thyromegaly present.  Cardiovascular: Normal rate, regular rhythm, normal heart sounds and intact distal pulses.   Pulmonary/Chest: Effort normal and breath sounds normal. He has no wheezes. He  has no rales.  Abdominal: Soft. Bowel sounds are normal. He exhibits no distension and no mass. There is no hepatosplenomegaly. There is no tenderness. No hernia.  Musculoskeletal: Normal range of motion.  Lymphadenopathy:    He has no cervical adenopathy.  Neurological: He is alert and oriented to person, place, and time. He displays normal reflexes. No sensory deficit. He exhibits normal muscle tone.  Skin: Skin is warm and dry. Capillary refill takes less than 2 seconds. No rash noted.  Psychiatric: He has a normal mood and affect. His behavior is normal.  Vitals reviewed.    ASSESSMENT &  PLAN: Jeudy was seen today for annual exam.  Diagnoses and all orders for this visit:  Routine general medical examination at a health care facility -     Comprehensive metabolic panel -     CBC with Differential -     Hemoglobin A1c -     Lipid panel -     TSH -     Hepatitis C antibody screen -     HIV antibody   Patient Instructions   Health Maintenance, Male A healthy lifestyle and preventative care can promote health and wellness.  Maintain regular health, dental, and eye exams.  Eat a healthy diet. Foods like vegetables, fruits, whole grains, low-fat dairy products, and lean protein foods contain the nutrients you need and are low in calories. Decrease your intake of foods high in solid fats, added sugars, and salt. Get information about a proper diet from your health care provider, if necessary.  Regular physical exercise is one of the most important things you can do for your health. Most adults should get at least 150 minutes of moderate-intensity exercise (any activity that increases your heart rate and causes you to sweat) each week. In addition, most adults need muscle-strengthening exercises on 2 or more days a week.   Maintain a healthy weight. The body mass index (BMI) is a screening tool to identify possible weight problems. It provides an estimate of body fat based on height and weight. Your health care provider can find your BMI and can help you achieve or maintain a healthy weight. For males 20 years and older:  A BMI below 18.5 is considered underweight.  A BMI of 18.5 to 24.9 is normal.  A BMI of 25 to 29.9 is considered overweight.  A BMI of 30 and above is considered obese.  Maintain normal blood lipids and cholesterol by exercising and minimizing your intake of saturated fat. Eat a balanced diet with plenty of fruits and vegetables. Blood tests for lipids and cholesterol should begin at age 84 and be repeated every 5 years. If your lipid or cholesterol  levels are high, you are over age 76, or you are at high risk for heart disease, you may need your cholesterol levels checked more frequently.Ongoing high lipid and cholesterol levels should be treated with medicines if diet and exercise are not working.  If you smoke, find out from your health care provider how to quit. If you do not use tobacco, do not start.  Lung cancer screening is recommended for adults aged 51-80 years who are at high risk for developing lung cancer because of a history of smoking. A yearly low-dose CT scan of the lungs is recommended for people who have at least a 30-pack-year history of smoking and are current smokers or have quit within the past 15 years. A pack year of smoking is smoking an average of 1  pack of cigarettes a day for 1 year (for example, a 30-pack-year history of smoking could mean smoking 1 pack a day for 30 years or 2 packs a day for 15 years). Yearly screening should continue until the smoker has stopped smoking for at least 15 years. Yearly screening should be stopped for people who develop a health problem that would prevent them from having lung cancer treatment.  If you choose to drink alcohol, do not have more than 2 drinks per day. One drink is considered to be 12 oz (360 mL) of beer, 5 oz (150 mL) of wine, or 1.5 oz (45 mL) of liquor.  Avoid the use of street drugs. Do not share needles with anyone. Ask for help if you need support or instructions about stopping the use of drugs.  High blood pressure causes heart disease and increases the risk of stroke. High blood pressure is more likely to develop in:  People who have blood pressure in the end of the normal range (100-139/85-89 mm Hg).  People who are overweight or obese.  People who are African American.  If you are 79-54 years of age, have your blood pressure checked every 3-5 years. If you are 72 years of age or older, have your blood pressure checked every year. You should have your blood  pressure measured twice-once when you are at a hospital or clinic, and once when you are not at a hospital or clinic. Record the average of the two measurements. To check your blood pressure when you are not at a hospital or clinic, you can use:  An automated blood pressure machine at a pharmacy.  A home blood pressure monitor.  If you are 27-32 years old, ask your health care provider if you should take aspirin to prevent heart disease.  Diabetes screening involves taking a blood sample to check your fasting blood sugar level. This should be done once every 3 years after age 34 if you are at a normal weight and without risk factors for diabetes. Testing should be considered at a younger age or be carried out more frequently if you are overweight and have at least 1 risk factor for diabetes.  Colorectal cancer can be detected and often prevented. Most routine colorectal cancer screening begins at the age of 80 and continues through age 64. However, your health care provider may recommend screening at an earlier age if you have risk factors for colon cancer. On a yearly basis, your health care provider may provide home test kits to check for hidden blood in the stool. A small camera at the end of a tube may be used to directly examine the colon (sigmoidoscopy or colonoscopy) to detect the earliest forms of colorectal cancer. Talk to your health care provider about this at age 30 when routine screening begins. A direct exam of the colon should be repeated every 5-10 years through age 47, unless early forms of precancerous polyps or small growths are found.  People who are at an increased risk for hepatitis B should be screened for this virus. You are considered at high risk for hepatitis B if:  You were born in a country where hepatitis B occurs often. Talk with your health care provider about which countries are considered high risk.  Your parents were born in a high-risk country and you have not  received a shot to protect against hepatitis B (hepatitis B vaccine).  You have HIV or AIDS.  You use needles to inject street  drugs.  You live with, or have sex with, someone who has hepatitis B.  You are a man who has sex with other men (MSM).  You get hemodialysis treatment.  You take certain medicines for conditions like cancer, organ transplantation, and autoimmune conditions.  Hepatitis C blood testing is recommended for all people born from [birth year] through 1965 and any individual with known risk factors for hepatitis C.  Healthy men should no longer receive prostate-specific antigen (PSA) blood tests as part of routine cancer screening. Talk to your health care provider about prostate cancer screening.  Testicular cancer screening is not recommended for adolescents or adult males who have no symptoms. Screening includes self-exam, a health care provider exam, and other screening tests. Consult with your health care provider about any symptoms you have or any concerns you have about testicular cancer.  Practice safe sex. Use condoms and avoid high-risk sexual practices to reduce the spread of sexually transmitted infections (STIs).  You should be screened for STIs, including gonorrhea and chlamydia if:  You are sexually active and are younger than 24 years.  You are older than 24 years, and your health care provider tells you that you are at risk for this type of infection.  Your sexual activity has changed since you were last screened, and you are at an increased risk for chlamydia or gonorrhea. Ask your health care provider if you are at risk.  If you are at risk of being infected with HIV, it is recommended that you take a prescription medicine daily to prevent HIV infection. This is called pre-exposure prophylaxis (PrEP). You are considered at risk if:  You are a man who has sex with other men (MSM).  You are a heterosexual man who is sexually active with multiple  partners.  You take drugs by injection.  You are sexually active with a partner who has HIV.  Talk with your health care provider about whether you are at high risk of being infected with HIV. If you choose to begin PrEP, you should first be tested for HIV. You should then be tested every 3 months for as long as you are taking PrEP.  Use sunscreen. Apply sunscreen liberally and repeatedly throughout the day. You should seek shade when your shadow is shorter than you. Protect yourself by wearing long sleeves, pants, a wide-brimmed hat, and sunglasses year round whenever you are outdoors.  Tell your health care provider of new moles or changes in moles, especially if there is a change in shape or color. Also, tell your health care provider if a mole is larger than the size of a pencil eraser.  A one-time screening for abdominal aortic aneurysm (AAA) and surgical repair of large AAAs by ultrasound is recommended for men aged 10-75 years who are current or former smokers.  Stay current with your vaccines (immunizations). This information is not intended to replace advice given to you by your health care provider. Make sure you discuss any questions you have with your health care provider. Document Released: 05/16/2008 Document Revised: 12/09/2014 Document Reviewed: 08/22/2015 Elsevier Interactive Patient Education  2017 [facility]) Exercise Recommendation  Being physically active is important to prevent heart disease and stroke, the nation's No. 1and No. 5killers. To improve overall cardiovascular health, we suggest at least 150 minutes per week of moderate exercise or 75 minutes per week of vigorous exercise (or a combination of moderate and vigorous activity). Thirty minutes a day, five times a  week is an easy goal to remember. You will also experience benefits even if you divide your time into two or three segments of 10 to 15 minutes per day.  For people  who would benefit from lowering their blood pressure or cholesterol, we recommend 40 minutes of aerobic exercise of moderate to vigorous intensity three to four times a week to lower the risk for heart attack and stroke.  Physical activity is anything that makes you move your body and burn calories.  This includes things like climbing stairs or playing sports. Aerobic exercises benefit your heart, and include walking, jogging, swimming or biking. Strength and stretching exercises are best for overall stamina and flexibility.  The simplest, positive change you can make to effectively improve your heart health is to start walking. It's enjoyable, free, easy, social and great exercise. A walking program is flexible and boasts high success rates because people can stick with it. It's easy for walking to become a regular and satisfying part of life.   For Overall Cardiovascular Health:  At least 30 minutes of moderate-intensity aerobic activity at least 5 days per week for a total of 150  OR   At least 25 minutes of vigorous aerobic activity at least 3 days per week for a total of 75 minutes; or a combination of moderate- and vigorous-intensity aerobic activity  AND   Moderate- to high-intensity muscle-strengthening activity at least 2 days per week for additional health benefits.  For Lowering Blood Pressure and Cholesterol  An average 40 minutes of moderate- to vigorous-intensity aerobic activity 3 or 4 times per week  What if I can't make it to the time goal? Something is always better than nothing! And everyone has to start somewhere. Even if you've been sedentary for years, today is the day you can begin to make healthy changes in your life. If you don't think you'll make it for 30 or 40 minutes, set a reachable goal for today. You can work up toward your overall goal by increasing your time as you get stronger. Don't let all-or-nothing thinking rob you of doing what you can every day.   Source:http://www.heart.Burnadette Pop, MD [facility]

## 2017-01-11 LAB — HEPATITIS C ANTIBODY: Hep C Virus Ab: <0.1 s/co ratio (ref 0.0–0.9)

## 2017-01-11 LAB — LIPID PANEL
CHOL/HDL RATIO: 3.8 ratio (ref 0.0–5.0)
Cholesterol, Total: 207 mg/dL — ABNORMAL HIGH (ref 100–199)
HDL: 55 mg/dL (ref >39)
LDL CALC: 127 mg/dL — AB (ref 0–99)
Triglycerides: 123 mg/dL (ref 0–149)
VLDL CHOLESTEROL CAL: 25 mg/dL (ref 5–40)

## 2017-01-11 LAB — CBC WITH DIFFERENTIAL/PLATELET
Basophils Absolute: 0 10*3/uL (ref 0.0–0.2)
Basos: 0 %
EOS (ABSOLUTE): 0 10*3/uL (ref 0.0–0.4)
EOS: 1 %
HEMATOCRIT: 52.6 % — AB (ref 37.5–51.0)
Hemoglobin: 17 g/dL (ref 13.0–17.7)
IMMATURE GRANS (ABS): 0 10*3/uL (ref 0.0–0.1)
Immature Granulocytes: 0 %
LYMPHS: 48 %
Lymphocytes Absolute: 2.3 10*3/uL (ref 0.7–3.1)
MCH: 30.9 pg (ref 26.6–33.0)
MCHC: 32.3 g/dL (ref 31.5–35.7)
MCV: 96 fL (ref 79–97)
MONOS ABS: 0.4 10*3/uL (ref 0.1–0.9)
Monocytes: 9 %
NEUTROS PCT: 42 %
Neutrophils Absolute: 2 10*3/uL (ref 1.4–7.0)
PLATELETS: 210 10*3/uL (ref 150–379)
RBC: 5.51 x10E6/uL (ref 4.14–5.80)
RDW: 14.2 % (ref 12.3–15.4)
WBC: 4.7 10*3/uL (ref 3.4–10.8)

## 2017-01-11 LAB — COMPREHENSIVE METABOLIC PANEL
ALK PHOS: 77 IU/L (ref 39–117)
ALT: 15 IU/L (ref 0–44)
AST: 24 IU/L (ref 0–40)
Albumin/Globulin Ratio: 1.5 (ref 1.2–2.2)
Albumin: 4.7 g/dL (ref 3.5–5.5)
BUN/Creatinine Ratio: 12 (ref 9–20)
BUN: 16 mg/dL (ref 6–24)
Bilirubin Total: 0.6 mg/dL (ref 0.0–1.2)
CALCIUM: 10 mg/dL (ref 8.7–10.2)
CO2: 22 mmol/L (ref 18–29)
CREATININE: 1.3 mg/dL — AB (ref 0.76–1.27)
Chloride: 99 mmol/L (ref 96–106)
GFR calc Af Amer: 71 mL/min/{1.73_m2} (ref >59)
GFR, EST NON AFRICAN AMERICAN: 61 mL/min/{1.73_m2} (ref >59)
GLOBULIN, TOTAL: 3.1 g/dL (ref 1.5–4.5)
GLUCOSE: 87 mg/dL (ref 65–99)
Potassium: 4.9 mmol/L (ref 3.5–5.2)
SODIUM: 141 mmol/L (ref 134–144)
Total Protein: 7.8 g/dL (ref 6.0–8.5)

## 2017-01-11 LAB — TSH: TSH: 0.976 u[IU]/mL (ref 0.450–4.500)

## 2017-01-11 LAB — HEMOGLOBIN A1C
Est. average glucose Bld gHb Est-mCnc: 117 mg/dL
Hgb A1c MFr Bld: 5.7 % — ABNORMAL HIGH (ref 4.8–5.6)

## 2017-01-11 LAB — HIV ANTIBODY (ROUTINE TESTING W REFLEX): HIV Screen 4th Generation wRfx: NONREACTIVE

## 2017-12-14 IMAGING — CR DG LUMBAR SPINE COMPLETE 4+V
5 series · 5 of 5 positions shown · non-contrast
Comparison: None.

CLINICAL DATA: Upper and lower back pain, no acute injury

EXAM:
LUMBAR SPINE - COMPLETE 4+ VIEW

[AP]
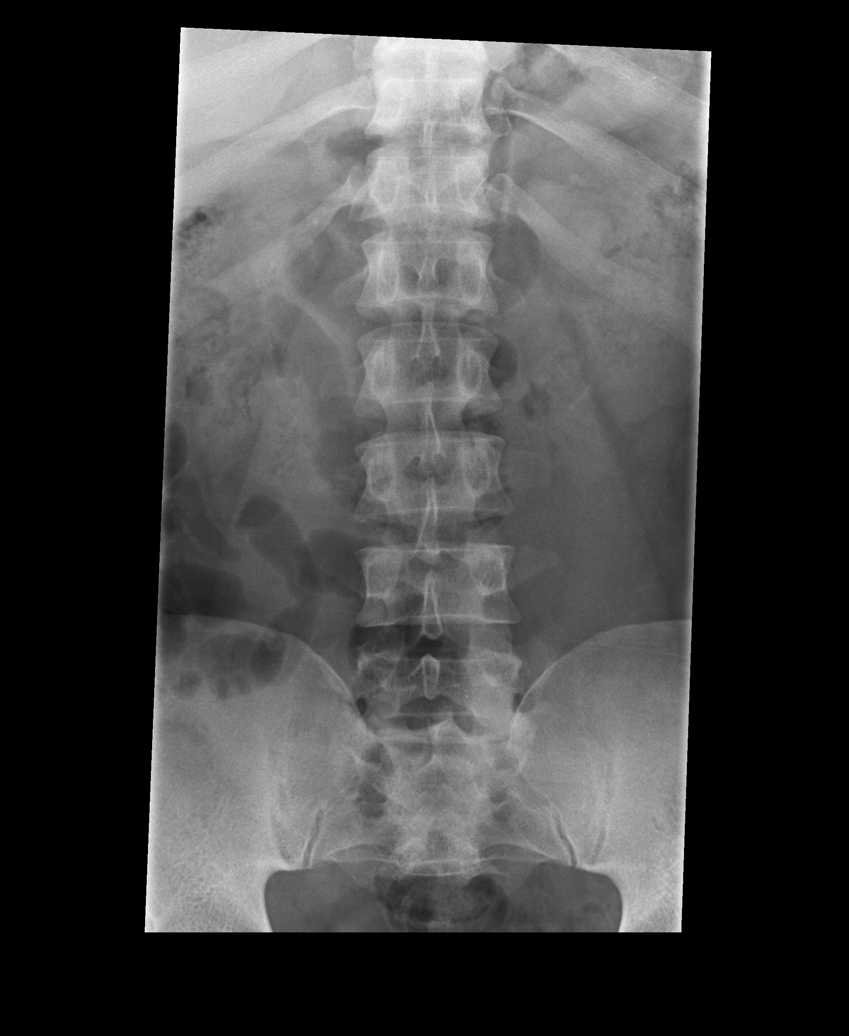

[rpo]
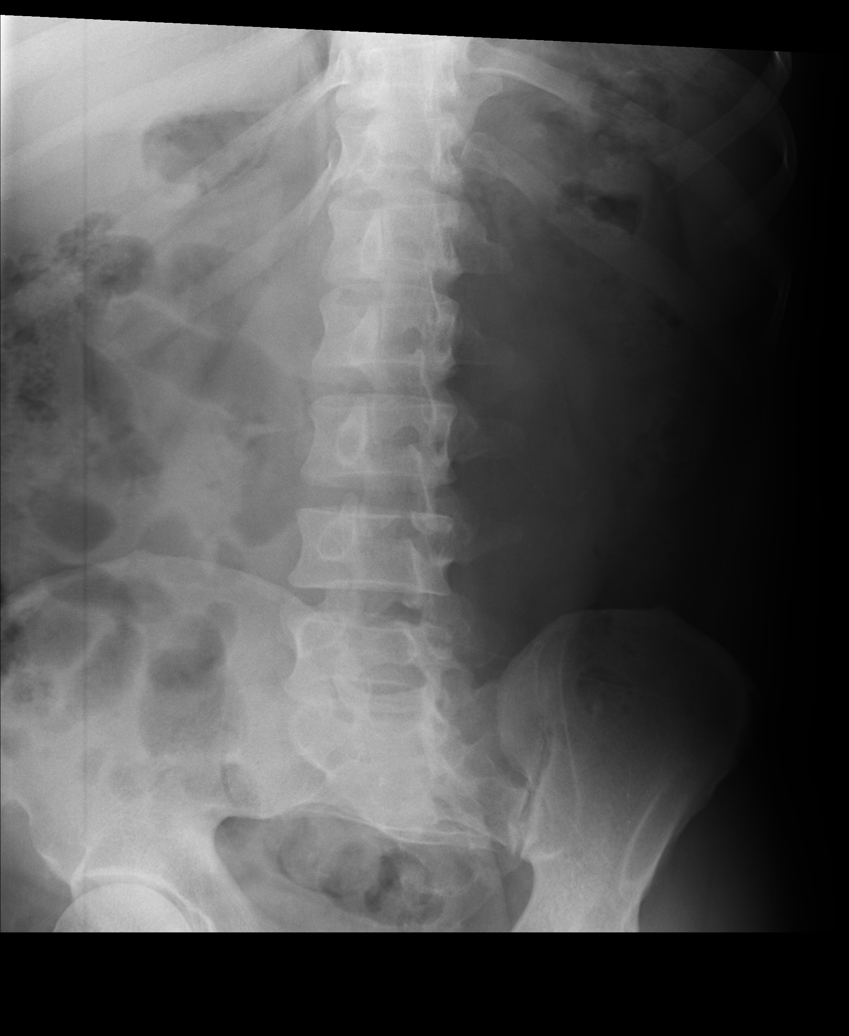

[lpo]
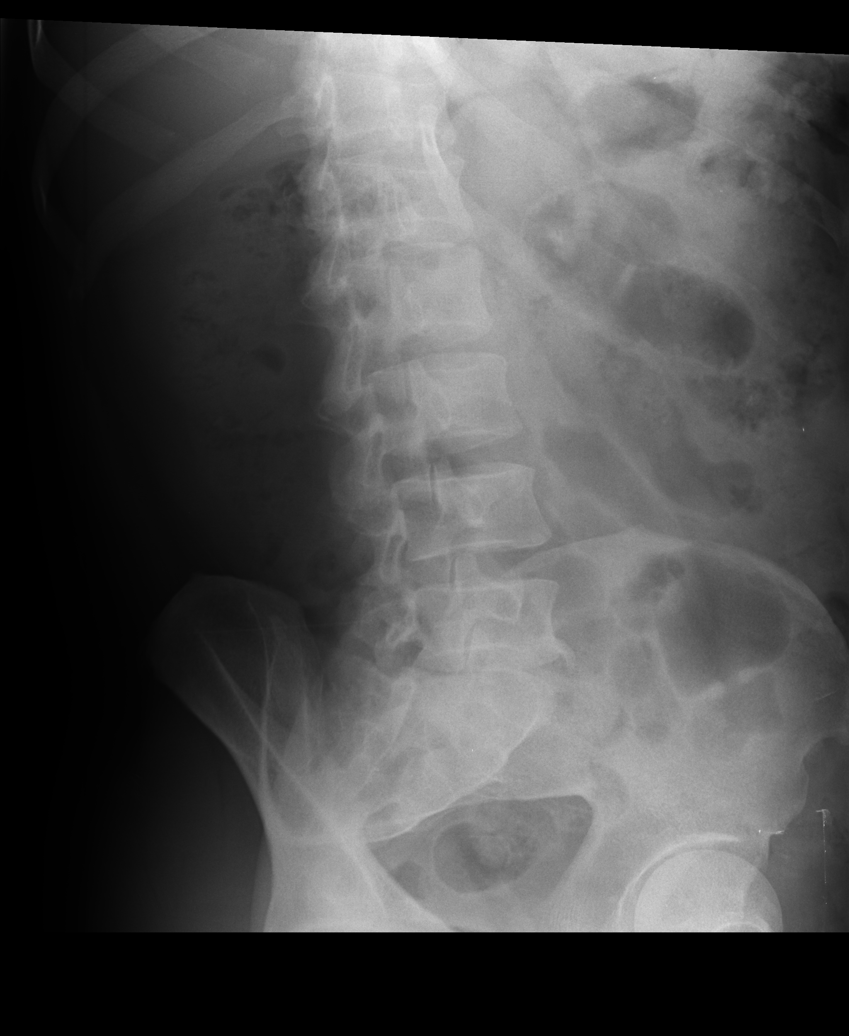

[lateral]
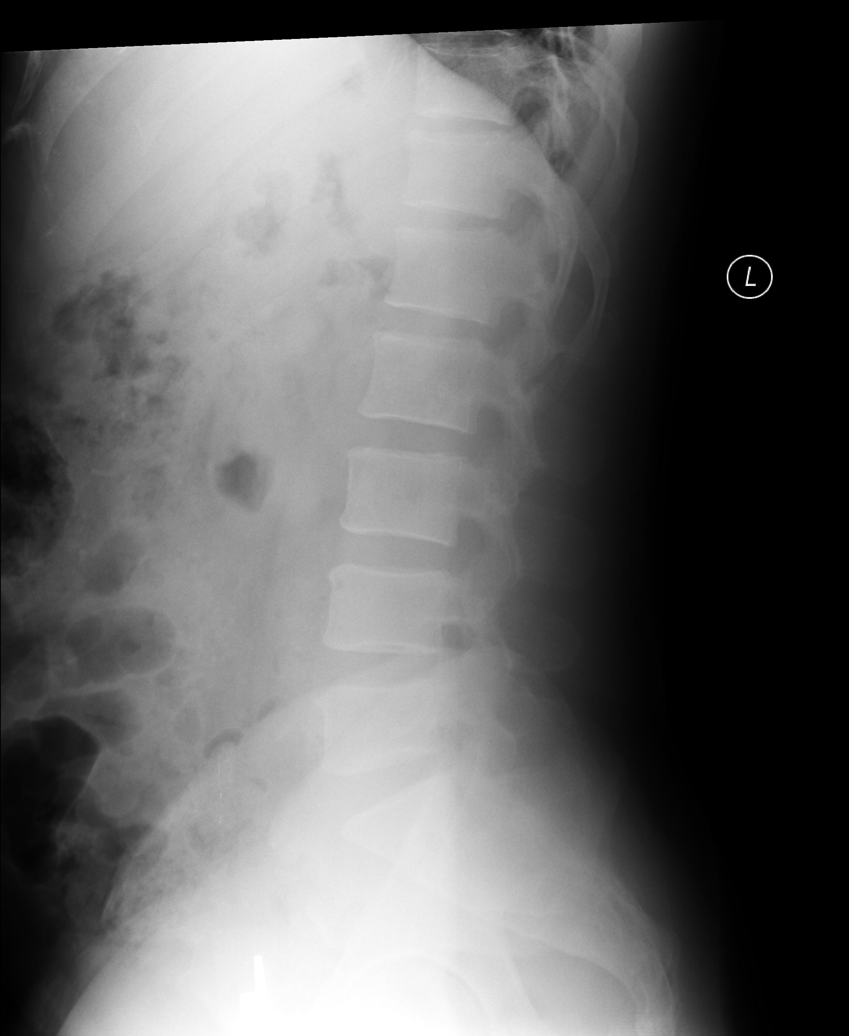

[l5 s1]
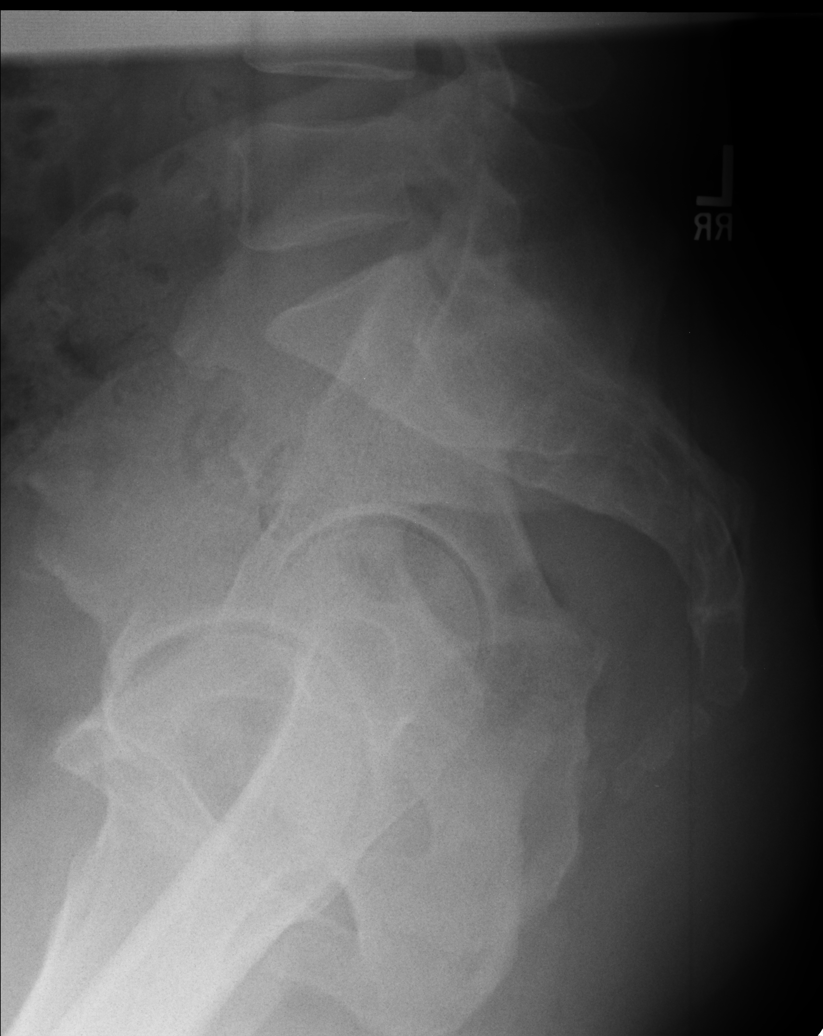

[5 of 5 positions shown; findings below may reference images not displayed]

FINDINGS: The lumbar vertebrae are in normal alignment. Intervertebral disc
spaces appear normal. No compression deformity is seen. On oblique
views the facet joints are unremarkable and no pars defect is seen.
The SI joints are corticated.
IMPRESSION: Normal alignment.  Normal intervertebral disc spaces.

## 2018-12-26 ENCOUNTER — Other Ambulatory Visit: Payer: Self-pay

## 2018-12-26 ENCOUNTER — Ambulatory Visit
Admission: EM | Admit: 2018-12-26 | Discharge: 2018-12-26 | Disposition: A | Discharge status: Home / Self Care | Payer: Self-pay | Attending: Family Medicine | Admitting: Family Medicine

## 2018-12-26 ENCOUNTER — Encounter: Payer: Self-pay | Admitting: Emergency Medicine

## 2018-12-26 DIAGNOSIS — R519 Headache, unspecified: Secondary | ICD-10-CM

## 2018-12-26 DIAGNOSIS — R51 Headache: Secondary | ICD-10-CM | POA: Insufficient documentation

## 2018-12-26 MED ORDER — IBUPROFEN 600 MG PO TABS: 600.0000 mg | ORAL_TABLET | Freq: Four times a day (QID) | ORAL | 0 refills | Status: DC | PRN

## 2018-12-26 NOTE — Discharge Instructions (Signed)
This pain could be related to shingles or just some nerve inflammation.  Ibuprofen for pain and inflammation. You can take 600 mg every 6 hours. Watch for a rash and if this appears you need to follow up for further treatment.

## 2018-12-26 NOTE — ED Triage Notes (Signed)
Per pt he has been having burning on top of his head. Pt has no headache or vision changes. Alert oriented x 4.

## 2018-12-27 NOTE — ED Provider Notes (Signed)
[place]    CSN: [id number] Arrival date & time: 12/26/18  1234     History   Chief Complaint Chief Complaint  Patient presents with  . Headache    HPI Dennis Wiggins is a 58 y.o. male.   Pt is a healthy 58 year old male that presents with sharp, burning sensation to the frontal scalp. This has been random and happening about 3 times a day for the past 2 days. Episodes lasting about 5 seconds. Nothing makes this problem better or worse. He has been taking 81 mg ASA that his mom told him to take for worries of stroke. He denies any associated headaches, fever, dizziness, vision changes, neck pain, numbness, tingling or weakness. He denies any hx of or family hx of stroke. He does not have HTN. No injury to the head. No falls. No respiratory symptoms.   ROS per HPI    Headache    History reviewed. No pertinent past medical history.  Patient Active Problem List   Diagnosis Date Noted  . Tobacco use disorder 10/16/2015  . Esophageal reflux 10/16/2015  . 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 Benign neoplasm of colon 05/08/2012    History reviewed. No pertinent surgical history.     Home Medications    Prior to Admission medications   Medication Sig Start Date End Date Taking? Authorizing Provider  albuterol (PROVENTIL HFA;VENTOLIN HFA) 108 (90 BASE) MCG/ACT inhaler Inhale 2 puffs into the lungs every 4 (four) hours as needed for wheezing or shortness of breath (cough, shortness of breath or wheezing.). 09/19/14   Harrison Mons, PA  ibuprofen (ADVIL,MOTRIN) 600 MG tablet Take 1 tablet (600 mg total) by mouth every 6 (six) hours as needed. 12/26/18   Loura Halt A, NP  meloxicam (MOBIC) 15 MG tablet Take 1 tablet (15 mg total) by mouth daily. Patient not taking: Reported on 01/10/2017 09/05/16   Tereasa Coop, PA-C    Family History Family History  Problem Relation Age of Onset  . Cancer Mother        unknown type; had chemotherapy    Social History Social  History   Tobacco Use  . Smoking status: Current Every Day Smoker    Packs/day: 0.50    Years: 13.00    Pack years: 6.50    Types: Cigarettes  . Smokeless tobacco: Never Used  Substance Use Topics  . Alcohol use: Yes    Alcohol/week: 3.0 standard drinks    Types: 1 Glasses of wine, 1 Cans of beer, 1 Shots of liquor per week  . Drug use: No     Allergies   Patient has no known allergies.   Review of Systems Review of Systems  Neurological: Positive for headaches.     Physical Exam Triage Vital Signs ED Triage Vitals  Enc Vitals Group     BP 12/26/18 1350 125/71     Pulse Rate 12/26/18 1350 60     Resp --      Temp 12/26/18 1350 97.6 F (36.4 C)     Temp Source 12/26/18 1350 Oral     SpO2 12/26/18 1350 97 %     Weight 12/26/18 1350 104 lb (47.2 kg)     Height 12/26/18 1350 5\' 6"  (1.676 m)     Head Circumference --      Peak Flow --      Pain Score 12/26/18 1349 0     Pain Loc --      Pain Edu? --  Excl. in GC? --    No data found.  Updated Vital Signs BP 125/71 (BP Location: Right Arm)   Pulse 60   Temp 97.6 F (36.4 C) (Oral)   Ht 5\' 6"  (1.676 m)   Wt 104 lb (47.2 kg)   SpO2 97%   BMI 16.79 kg/m   Visual Acuity Right Eye Distance:   Left Eye Distance:   Bilateral Distance:    Right Eye Near:   Left Eye Near:    Bilateral Near:     Physical Exam Vitals signs and nursing note reviewed.  Constitutional:      General: He is not in acute distress.    Appearance: He is well-developed. He is not ill-appearing, toxic-appearing or diaphoretic.  HENT:     Head: Normocephalic and atraumatic.     Mouth/Throat:     Mouth: Mucous membranes are moist.     Pharynx: Oropharynx is clear.  Eyes:     General: No visual field deficit.    Extraocular Movements: Extraocular movements intact.     Conjunctiva/sclera: Conjunctivae normal.     Pupils: Pupils are equal, round, and reactive to light.  Neck:     Musculoskeletal: Normal range of motion and  neck supple. No neck rigidity.  Cardiovascular:     Rate and Rhythm: Normal rate and regular rhythm.     Heart sounds: No murmur.  Pulmonary:     Effort: Pulmonary effort is normal. No respiratory distress.     Breath sounds: Normal breath sounds.  Abdominal:     Palpations: Abdomen is soft.     Tenderness: There is no abdominal tenderness.  Musculoskeletal: Normal range of motion.  Lymphadenopathy:     Cervical: No cervical adenopathy.  Skin:    General: Skin is warm and dry.     Findings: No rash.  Neurological:     Mental Status: He is alert.     Cranial Nerves: No cranial nerve deficit, dysarthria or facial asymmetry.     Sensory: No sensory deficit.     Motor: No weakness.     Coordination: Romberg sign negative. Coordination normal.     Gait: Gait normal.  Psychiatric:        Mood and Affect: Mood normal.        Speech: Speech normal.        Behavior: Behavior normal.      UC Treatments / Results  Labs (all labs ordered are listed, but only abnormal results are displayed) Labs Reviewed - No data to display  EKG None  Radiology No results found.  Procedures Procedures (including critical care time)  Medications Ordered in UC Medications - No data to display  Initial Impression / Assessment and Plan / UC Course  I have reviewed the triage vital signs and the nursing notes.  Pertinent labs & imaging results that were available during my care of the patient were reviewed by me and considered in my medical decision making (see chart for details).     Exam benign VSS, non toxic or ill appearing No swelling where the pain is. No redness, no bruising.  No focal neuro deficits No other concerning signs or symptoms.   Problem is most likely self limiting.  Only other dx could be shingles but not convinced There is currently no rash.  Instructed pt to monitor and look for rash Return for treatment if rash appears.  Ibuprofen for the pain as needed For  worsening symptoms or concerning signs go to  the ER.  Final Clinical Impressions(s) / UC Diagnoses   Final diagnoses:  Pain of scalp     Discharge Instructions     This pain could be related to shingles or just some nerve inflammation.  Ibuprofen for pain and inflammation. You can take 600 mg every 6 hours. Watch for a rash and if this appears you need to follow up for further treatment.     ED Prescriptions    Medication Sig Dispense Auth. Provider   ibuprofen (ADVIL,MOTRIN) 600 MG tablet Take 1 tablet (600 mg total) by mouth every 6 (six) hours as needed. 30 tablet Orvan July, NP     Controlled Substance Prescriptions [place] Controlled Substance Registry consulted? no   Orvan July, NP 12/27/18 1439

## 2019-03-10 ENCOUNTER — Other Ambulatory Visit: Payer: Self-pay

## 2019-03-10 ENCOUNTER — Encounter: Payer: Self-pay | Admitting: Family Medicine

## 2019-03-10 NOTE — Progress Notes (Signed)
Pt states he has been having stomach pain x 3 weeks with some diarrhea. Pt states its been 3 to 4 times a day with the Diarrhea and abdominal pain.

## 2019-03-10 NOTE — Progress Notes (Signed)
Cancelled appointment

## 2019-03-11 ENCOUNTER — Other Ambulatory Visit: Payer: Self-pay

## 2019-03-11 ENCOUNTER — Telehealth (INDEPENDENT_AMBULATORY_CARE_PROVIDER_SITE_OTHER): Payer: Self-pay | Admitting: Family Medicine

## 2019-03-11 DIAGNOSIS — R197 Diarrhea, unspecified: Secondary | ICD-10-CM

## 2019-03-11 DIAGNOSIS — R109 Unspecified abdominal pain: Secondary | ICD-10-CM

## 2019-03-11 MED ORDER — OMEPRAZOLE 20 MG PO CPDR: 20.0000 mg | DELAYED_RELEASE_CAPSULE | Freq: Every day | ORAL | 1 refills | Status: DC

## 2019-03-11 NOTE — Progress Notes (Signed)
 Virtual Visit via Telephone Note  I connected with Dennis Wiggins on 03/11/19 at 3:07 PM by telephone and verified that I am speaking with the correct person using two identifiers.   I discussed the limitations, risks, security and privacy concerns of performing an evaluation and management service by telephone and the availability of in person appointments. I also discussed with the patient that there may be a patient responsible charge related to this service. The patient expressed understanding and agreed to proceed, consent obtained  Chief complaint: abd pain, diarrhea.    History of Present Illness: Abd pain, diarrhea Diarrhea started 2-3 months ago. 3-4 bowel movements every morning. Loose stool -watery.  No fevers, no weight loss, no night sweats No sick conatacts.  No foreign travel.  Started with abd pain 3 weeks ago - not worsening.  Burning pain in stomach from belly button upward to stomach.  No vomiting.  No black/tar colored stools. No hard stools.  No coffee, but drinks coca cola - up to one per day, but not daily.  Tried ginger. Seen by another doctor for these sx's 2 weeks ago, no testing. Gave Rx for omeprazole one per day for a week. No change in symptoms. No recent abx, no hospitalizations.   For rectal polyps removed with otherwise normal colonoscopy up to the cecum in 2013 with plan on follow-up in 5 to 10 years.  Dr. Mann.  Hyperplastic polyps, mucosal prolapse polyp.   Patient Active Problem List   Diagnosis Date Noted  . Tobacco use disorder 10/16/2015  . Esophageal reflux 10/16/2015  . 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 No past surgical history on file. No Known Allergies Prior to Admission medications   Not on File   Social History   Socioeconomic History  . Marital status: Single    Spouse name: Not on file  . Number of children: 4  . Years of education: 12th grade  . Highest  education level: Not on file  Occupational History  . Occupation: vault caster    Employer: [employer]  Social Needs  . Financial resource strain: Not on file  . Food insecurity:    Worry: Not on file    Inability: Not on file  . Transportation needs:    Medical: Not on file    Non-medical: Not on file  Tobacco Use  . Smoking status: Current Every Day Smoker    Packs/day: 0.50    Years: 13.00    Pack years: 6.50    Types: Cigarettes  . Smokeless tobacco: Never Used  Substance and Sexual Activity  . Alcohol use: Yes    Alcohol/week: 3.0 standard drinks    Types: 1 Glasses of wine, 1 Cans of beer, 1 Shots of liquor per week  . Drug use: No  . Sexual activity: Not on file  Lifestyle  . Physical activity:    Days per week: Not on file    Minutes per session: Not on file  . Stress: Not on file  Relationships  . Social connections:    Talks on phone: Not on file    Gets together: Not on file    Attends religious service: Not on file    Active member of club or organization: Not on file    Attends meetings of clubs or organizations: Not on file    Relationship status: Not on file  . Intimate partner violence:    Fear   of current or ex partner: Not on file    Emotionally abused: Not on file    Physically abused: Not on file    Forced sexual activity: Not on file  Other Topics Concern  . Not on file  Social History Narrative   From [place]. Came to the [place] in 2003.     Observations/Objective: No distress on phone.  Assessment and Plan: Diarrhea, unspecified type - Plan: Stool culture, CBC, Ambulatory referral to Gastroenterology  Abdominal pain, unspecified abdominal location - Plan: Stool culture, CBC, Comprehensive metabolic panel, Ambulatory referral to Gastroenterology, omeprazole (PRILOSEC) 20 MG capsule Persistent diarrhea with recent abdominal pain, but no fevers.  Tolerating p.o. fluids and food.  -Initially start with CBC, C met, stool culture and refer  to gastroenterology.  Based on description of discomfort, can continue PPI although minimal improvement with initial trial.  No concerning findings on history for ER visit or imaging at this time, but consider in office eval or imaging if not improving or worsening symptoms.  Follow Up Instructions:  Patient Instructions  Continue omeprazole once per day.  Fiber in diet and sufficient water throughout day. Avoid caffeinated beverages and artificial sweeteners.  Bloodwork and stool test may help decide other treatment, but I will also refer you to gastroenterology.  Return to the clinic or go to the nearest emergency room if any of your symptoms worsen or new symptoms occur.   Diarrhea, Adult Diarrhea is frequent loose and watery bowel movements. Diarrhea can make you feel weak and cause you to become dehydrated. Dehydration can make you tired and thirsty, cause you to have a dry mouth, and decrease how often you urinate. Diarrhea typically lasts 2-3 days. However, it can last longer if it is a sign of something more serious. It is important to treat your diarrhea as told by your health care provider. Follow these instructions at home: Eating and drinking     Follow these recommendations as told by your health care provider:  Take an oral rehydration solution (ORS). This is an over-the-counter medicine that helps return your body to its normal balance of nutrients and water. It is found at pharmacies and retail stores.  Drink plenty of fluids, such as water, ice chips, diluted fruit juice, and low-calorie sports drinks. You can drink milk also, if desired.  Avoid drinking fluids that contain a lot of sugar or caffeine, such as energy drinks, sports drinks, and soda.  Eat bland, easy-to-digest foods in small amounts as you are able. These foods include bananas, applesauce, rice, lean meats, toast, and crackers.  Avoid alcohol.  Avoid spicy or fatty foods.  Medicines  Take  over-the-counter and prescription medicines only as told by your health care provider.  If you were prescribed an antibiotic medicine, take it as told by your health care provider. Do not stop using the antibiotic even if you start to feel better. General instructions   Wash your hands often using soap and water. If soap and water are not available, use a hand sanitizer. Others in the household should wash their hands as well. Hands should be washed: ? After using the toilet or changing a diaper. ? Before preparing, cooking, or serving food. ? While caring for a sick person or while visiting someone in a hospital.  Drink enough fluid to keep your urine pale yellow.  Rest at home while you recover.  Watch your condition for any changes.  Take a warm bath to relieve any burning or pain   from frequent diarrhea episodes.  Keep all follow-up visits as told by your health care provider. This is important. Contact a health care provider if:  You have a fever.  Your diarrhea gets worse.  You have new symptoms.  You cannot keep fluids down.  You feel light-headed or dizzy.  You have a headache.  You have muscle cramps. Get help right away if:  You have chest pain.  You feel extremely weak or you faint.  You have bloody or black stools or stools that look like tar.  You have severe pain, cramping, or bloating in your abdomen.  You have trouble breathing or you are breathing very quickly.  Your heart is beating very quickly.  Your skin feels cold and clammy.  You feel confused.  You have signs of dehydration, such as: ? Dark urine, very little urine, or no urine. ? Cracked lips. ? Dry mouth. ? Sunken eyes. ? Sleepiness. ? Weakness. Summary  Diarrhea is frequent loose and watery bowel movements. Diarrhea can make you feel weak and cause you to become dehydrated.  Drink enough fluids to keep your urine pale yellow.  Make sure that you wash your hands after using  the toilet. If soap and water are not available, use hand sanitizer.  Contact a health care provider if your diarrhea gets worse or you have new symptoms.  Get help right away if you have signs of dehydration. This information is not intended to replace advice given to you by your health care provider. Make sure you discuss any questions you have with your health care provider. Document Released: 11/08/2002 Document Revised: 04/24/2018 Document Reviewed: 04/24/2018 Elsevier Interactive Patient Education  2019 Elsevier Inc.  Abdominal Pain, Adult Abdominal pain can be caused by many things. Often, abdominal pain is not serious and it gets better with no treatment or by being treated at home. However, sometimes abdominal pain is serious. Your health care provider will do a medical history and a physical exam to try to determine the cause of your abdominal pain. Follow these instructions at home:  Take over-the-counter and prescription medicines only as told by your health care provider. Do not take a laxative unless told by your health care provider.  Drink enough fluid to keep your urine clear or pale yellow.  Watch your condition for any changes.  Keep all follow-up visits as told by your health care provider. This is important. Contact a health care provider if:  Your abdominal pain changes or gets worse.  You are not hungry or you lose weight without trying.  You are constipated or have diarrhea for more than 2-3 days.  You have pain when you urinate or have a bowel movement.  Your abdominal pain wakes you up at night.  Your pain gets worse with meals, after eating, or with certain foods.  You are throwing up and cannot keep anything down.  You have a fever. Get help right away if:  Your pain does not go away as soon as your health care provider told you to expect.  You cannot stop throwing up.  Your pain is only in areas of the abdomen, such as the right side or the  left lower portion of the abdomen.  You have bloody or black stools, or stools that look like tar.  You have severe pain, cramping, or bloating in your abdomen.  You have signs of dehydration, such as: ? Dark urine, very little urine, or no urine. ? Cracked lips. ?   Dry mouth. ? Sunken eyes. ? Sleepiness. ? Weakness. This information is not intended to replace advice given to you by your health care provider. Make sure you discuss any questions you have with your health care provider. Document Released: 08/28/2005 Document Revised: 06/07/2016 Document Reviewed: 05/01/2016 Elsevier Interactive Patient Education  2019 [employer].      I discussed the assessment and treatment plan with the patient. The patient was provided an opportunity to ask questions and all were answered. The patient agreed with the plan and demonstrated an understanding of the instructions.   The patient was advised to call back or seek an in-person evaluation if the symptoms worsen or if the condition fails to improve as anticipated.  I provided 18 minutes of non-face-to-face time during this encounter.  Signed,   Merri Ray, MD [facility].  03/11/19

## 2019-03-11 NOTE — Progress Notes (Signed)
diarrhea and stomach pains going on 2-3 weeks

## 2019-03-11 NOTE — Patient Instructions (Signed)
Continue omeprazole once per day.  Fiber in diet and sufficient water throughout day. Avoid caffeinated beverages and artificial sweeteners.  Bloodwork and stool test may help decide other treatment, but I will also refer you to gastroenterology.  Return to the clinic or go to the nearest emergency room if any of your symptoms worsen or new symptoms occur.   Diarrhea, Adult Diarrhea is frequent loose and watery bowel movements. Diarrhea can make you feel weak and cause you to become dehydrated. Dehydration can make you tired and thirsty, cause you to have a dry mouth, and decrease how often you urinate. Diarrhea typically lasts 2-3 days. However, it can last longer if it is a sign of something more serious. It is important to treat your diarrhea as told by your health care provider. Follow these instructions at home: Eating and drinking     Follow these recommendations as told by your health care provider:  Take an oral rehydration solution (ORS). This is an over-the-counter medicine that helps return your body to its normal balance of nutrients and water. It is found at pharmacies and retail stores.  Drink plenty of fluids, such as water, ice chips, diluted fruit juice, and low-calorie sports drinks. You can drink milk also, if desired.  Avoid drinking fluids that contain a lot of sugar or caffeine, such as energy drinks, sports drinks, and soda.  Eat bland, easy-to-digest foods in small amounts as you are able. These foods include bananas, applesauce, rice, lean meats, toast, and crackers.  Avoid alcohol.  Avoid spicy or fatty foods.  Medicines  Take over-the-counter and prescription medicines only as told by your health care provider.  If you were prescribed an antibiotic medicine, take it as told by your health care provider. Do not stop using the antibiotic even if you start to feel better. General instructions   Wash your hands often using soap and water. If soap and water  are not available, use a hand sanitizer. Others in the household should wash their hands as well. Hands should be washed: ? After using the toilet or changing a diaper. ? Before preparing, cooking, or serving food. ? While caring for a sick person or while visiting someone in a hospital.  Drink enough fluid to keep your urine pale yellow.  Rest at home while you recover.  Watch your condition for any changes.  Take a warm bath to relieve any burning or pain from frequent diarrhea episodes.  Keep all follow-up visits as told by your health care provider. This is important. Contact a health care provider if:  You have a fever.  Your diarrhea gets worse.  You have new symptoms.  You cannot keep fluids down.  You feel light-headed or dizzy.  You have a headache.  You have muscle cramps. Get help right away if:  You have chest pain.  You feel extremely weak or you faint.  You have bloody or black stools or stools that look like tar.  You have severe pain, cramping, or bloating in your abdomen.  You have trouble breathing or you are breathing very quickly.  Your heart is beating very quickly.  Your skin feels cold and clammy.  You feel confused.  You have signs of dehydration, such as: ? Dark urine, very little urine, or no urine. ? Cracked lips. ? Dry mouth. ? Sunken eyes. ? Sleepiness. ? Weakness. Summary  Diarrhea is frequent loose and watery bowel movements. Diarrhea can make you feel weak and cause you to  become dehydrated.  Drink enough fluids to keep your urine pale yellow.  Make sure that you wash your hands after using the toilet. If soap and water are not available, use hand sanitizer.  Contact a health care provider if your diarrhea gets worse or you have new symptoms.  Get help right away if you have signs of dehydration. This information is not intended to replace advice given to you by your health care provider. Make sure you discuss any  questions you have with your health care provider. Document Released: 11/08/2002 Document Revised: 04/24/2018 Document Reviewed: 04/24/2018 Elsevier Interactive Patient Education  2019 Elsevier Inc.  Abdominal Pain, Adult Abdominal pain can be caused by many things. Often, abdominal pain is not serious and it gets better with no treatment or by being treated at home. However, sometimes abdominal pain is serious. Your health care provider will do a medical history and a physical exam to try to determine the cause of your abdominal pain. Follow these instructions at home:  Take over-the-counter and prescription medicines only as told by your health care provider. Do not take a laxative unless told by your health care provider.  Drink enough fluid to keep your urine clear or pale yellow.  Watch your condition for any changes.  Keep all follow-up visits as told by your health care provider. This is important. Contact a health care provider if:  Your abdominal pain changes or gets worse.  You are not hungry or you lose weight without trying.  You are constipated or have diarrhea for more than 2-3 days.  You have pain when you urinate or have a bowel movement.  Your abdominal pain wakes you up at night.  Your pain gets worse with meals, after eating, or with certain foods.  You are throwing up and cannot keep anything down.  You have a fever. Get help right away if:  Your pain does not go away as soon as your health care provider told you to expect.  You cannot stop throwing up.  Your pain is only in areas of the abdomen, such as the right side or the left lower portion of the abdomen.  You have bloody or black stools, or stools that look like tar.  You have severe pain, cramping, or bloating in your abdomen.  You have signs of dehydration, such as: ? Dark urine, very little urine, or no urine. ? Cracked lips. ? Dry mouth. ? Sunken eyes. ? Sleepiness. ? Weakness. This  information is not intended to replace advice given to you by your health care provider. Make sure you discuss any questions you have with your health care provider. Document Released: 08/28/2005 Document Revised: 06/07/2016 Document Reviewed: 05/01/2016 Elsevier Interactive Patient Education  2019 [employer].

## 2019-03-12 ENCOUNTER — Ambulatory Visit (INDEPENDENT_AMBULATORY_CARE_PROVIDER_SITE_OTHER): Payer: Self-pay | Admitting: Family Medicine

## 2019-03-12 ENCOUNTER — Other Ambulatory Visit: Payer: Self-pay

## 2019-03-12 DIAGNOSIS — R197 Diarrhea, unspecified: Secondary | ICD-10-CM

## 2019-03-12 DIAGNOSIS — R109 Unspecified abdominal pain: Secondary | ICD-10-CM

## 2019-03-13 LAB — COMPREHENSIVE METABOLIC PANEL
ALT: 17 IU/L (ref 0–44)
AST: 22 IU/L (ref 0–40)
Albumin/Globulin Ratio: 2.4 — ABNORMAL HIGH (ref 1.2–2.2)
Albumin: 4.8 g/dL (ref 3.8–4.9)
Alkaline Phosphatase: 78 IU/L (ref 39–117)
BUN/Creatinine Ratio: 14 (ref 9–20)
BUN: 15 mg/dL (ref 6–24)
Bilirubin Total: <0.2 mg/dL (ref 0.0–1.2)
CO2: 19 mmol/L — ABNORMAL LOW (ref 20–29)
Calcium: 10 mg/dL (ref 8.7–10.2)
Chloride: 104 mmol/L (ref 96–106)
Creatinine, Ser: 1.08 mg/dL (ref 0.76–1.27)
GFR calc Af Amer: 87 mL/min/{1.73_m2} (ref >59)
GFR calc non Af Amer: 75 mL/min/{1.73_m2} (ref >59)
Globulin, Total: 2 g/dL (ref 1.5–4.5)
Glucose: 85 mg/dL (ref 65–99)
Potassium: 5 mmol/L (ref 3.5–5.2)
Sodium: 140 mmol/L (ref 134–144)
Total Protein: 6.8 g/dL (ref 6.0–8.5)

## 2019-03-13 LAB — CBC
Hematocrit: 48.7 % (ref 37.5–51.0)
Hemoglobin: 17 g/dL (ref 13.0–17.7)
MCH: 30.9 pg (ref 26.6–33.0)
MCHC: 34.9 g/dL (ref 31.5–35.7)
MCV: 89 fL (ref 79–97)
Platelets: 171 10*3/uL (ref 150–450)
RBC: 5.5 x10E6/uL (ref 4.14–5.80)
RDW: 13 % (ref 11.6–15.4)
WBC: 5.1 10*3/uL (ref 3.4–10.8)

## 2019-03-16 ENCOUNTER — Other Ambulatory Visit: Payer: Self-pay

## 2019-03-16 ENCOUNTER — Encounter: Payer: Self-pay | Admitting: Gastroenterology

## 2019-03-16 ENCOUNTER — Ambulatory Visit (INDEPENDENT_AMBULATORY_CARE_PROVIDER_SITE_OTHER): Payer: Self-pay | Admitting: Gastroenterology

## 2019-03-16 VITALS — Wt 187.0 lb

## 2019-03-16 DIAGNOSIS — K529 Noninfective gastroenteritis and colitis, unspecified: Secondary | ICD-10-CM

## 2019-03-16 NOTE — Progress Notes (Signed)
This patient contacted our office requesting a physician telemedicine video consultation regarding clinical questions and/or test results.  If new patient, they were referred by Dr. Merri Ray  Participants on the Zoom conference : myself and patient   The patient consented to phone consultation and was aware that a charge will be placed through their insurance.  I was in my office and the patient was at home   Encounter time:  Total time 29 minutes, with 20 minutes spent with patient on phone/webex    Wilfrid Lund, MD   _____________________________________________________________________________________________              [facility] Consult Note:  History: Dennis Wiggins 03/16/2019  Referring physician: Wardell Honour, MD  Reason for consult/chief complaint: Abdominal Pain and Diarrhea  Some difficulty understanding patient due to thick accent  Subjective  HPI:  For years would get intermittent burning abdominal pain.  Seen by PCP 03/11/19 for 2-3 months of loose stool daily. June 2013 colonoscopy with Dr. Collene Mares - hyperplastic and mucosal prolapse-type rectal polyps (pathology report scanned in epic) Went to [facility] urgent care several weeks ago. Was given prilosec.  Some days 3-5 times/day with cramps and loose/nonbloody stool and lots of intestinal noise. Has not been able to bring stool specimen to lab yet. Better ? Last several days on omeprazole  ROS:  Review of Systems No chest pain No dyspnea No rectal bleeding No weight loss - appetite good No fever   Past Medical History: History reviewed. No pertinent past medical history. No chronic medical problems  Past Surgical History: History reviewed. No pertinent surgical history. No surgery   Family History: Family History  Problem Relation Age of Onset  . Cancer Mother        unknown type; had chemotherapy    Social History: Social History   Socioeconomic History  .  Marital status: Single    Spouse name: Not on file  . Number of children: 4  . Years of education: 12th grade  . Highest education level: Not on file  Occupational History  . Occupation: Publishing rights manager: [employer]  Social Needs  . Financial resource strain: Not on file  . Food insecurity:    Worry: Not on file    Inability: Not on file  . Transportation needs:    Medical: Not on file    Non-medical: Not on file  Tobacco Use  . Smoking status: Current Every Day Smoker    Packs/day: 0.50    Years: 13.00    Pack years: 6.50    Types: Cigarettes  . Smokeless tobacco: Never Used  Substance and Sexual Activity  . Alcohol use: Yes    Alcohol/week: 3.0 standard drinks    Types: 1 Glasses of wine, 1 Cans of beer, 1 Shots of liquor per week  . Drug use: No  . Sexual activity: Not on file  Lifestyle  . Physical activity:    Days per week: Not on file    Minutes per session: Not on file  . Stress: Not on file  Relationships  . Social connections:    Talks on phone: Not on file    Gets together: Not on file    Attends religious service: Not on file    Active member of club or organization: Not on file    Attends meetings of clubs or organizations: Not on file    Relationship status: Not on file  Other Topics Concern  . Not  on file  Social History Narrative   From [place]. Came to the [place] in 2003.    Allergies: No Known Allergies  Outpatient Meds: Current Outpatient Medications  Medication Sig Dispense Refill  . omeprazole (PRILOSEC) 20 MG capsule Take 1 capsule (20 mg total) by mouth daily. 30 capsule 1   No current facility-administered medications for this visit.       ___________________________________________________________________ Objective      Labs:  CBC Latest Ref Rng & Units 03/12/2019 01/10/2017 10/16/2015  WBC 3.4 - 10.8 x10E3/uL 5.1 4.7 4.9  Hemoglobin 13.0 - 17.7 g/dL 17.0 17.0 16.6  Hematocrit 37.5 - 51.0 % 48.7 52.6(H) 48.5   Platelets 150 - 450 x10E3/uL 171 210 -   CMP Latest Ref Rng & Units 03/12/2019 01/10/2017 10/16/2015  Glucose 65 - 99 mg/dL 85 87 85  BUN 6 - 24 mg/dL 15 16 14   Creatinine 0.76 - 1.27 mg/dL 1.08 1.30(H) 1.02  Sodium 134 - 144 mmol/L 140 141 140  Potassium 3.5 - 5.2 mmol/L 5.0 4.9 4.7  Chloride 96 - 106 mmol/L 104 99 104  CO2 20 - 29 mmol/L 19(L) 22 26  Calcium 8.7 - 10.2 mg/dL 10.0 10.0 9.7  Total Protein 6.0 - 8.5 g/dL 6.8 7.8 7.3  Total Bilirubin 0.0 - 1.2 mg/dL <0.2 0.6 0.6  Alkaline Phos 39 - 117 IU/L 78 77 68  AST 0 - 40 IU/L 22 24 20   ALT 0 - 44 IU/L 17 15 13     Assessment: Encounter Diagnosis  Name Primary?  . Chronic diarrhea Yes   Difficult to characterize symptoms.  Hard to follow patient. ? Infectious or inflammatory.  Encouraged to do stool study this week, use imodium instead of prilosec and we will arrange clinic visit with me in 3-4 weeks.   Thank you for the courtesy of this consult.  Please call me with any questions or concerns.  Nelida Meuse III  CC: Referring provider noted above

## 2019-03-19 NOTE — Addendum Note (Signed)
Addended by: Dierdre Searles on: 03/19/2019 11:48 AM   Modules accepted: Orders

## 2019-03-25 LAB — STOOL CULTURE: E coli, Shiga toxin Assay: NEGATIVE

## 2019-04-01 ENCOUNTER — Telehealth: Payer: Self-pay

## 2019-04-01 ENCOUNTER — Other Ambulatory Visit: Payer: Self-pay

## 2019-04-01 DIAGNOSIS — R109 Unspecified abdominal pain: Secondary | ICD-10-CM

## 2019-04-01 MED ORDER — OMEPRAZOLE 20 MG PO CPDR: 20.0000 mg | DELAYED_RELEASE_CAPSULE | Freq: Every day | ORAL | 1 refills | Status: DC

## 2019-04-01 NOTE — Telephone Encounter (Signed)
Pt returned call, gave him the results of his stool culture testing. Says the medication he as been taking has helped him tremendously. Sent another refill to the pharmacy. Says he has not other medical concerns at this time.

## 2019-04-06 ENCOUNTER — Telehealth: Payer: Self-pay

## 2019-04-06 NOTE — Telephone Encounter (Signed)
Copied from [place] [id number]. Topic: General - Inquiry >> Mar 31, 2019  3:21 PM Richardo Priest, [place] wrote: Reason for CRM: Patient called in stating he needs his lab results read to him if possible. Call back is [phone] and it is ok to leave a detailed message.

## 2019-04-06 NOTE — Telephone Encounter (Signed)
I left vm and  As noted recorded by Kittie Plater, [place] on 04/01/2019 at 8:41 AM EDT Attempted to call pt. No answer left message to call back. Letter has been mailed to pt home.

## 2019-04-20 ENCOUNTER — Other Ambulatory Visit: Payer: Self-pay

## 2019-04-20 ENCOUNTER — Encounter: Payer: Self-pay | Admitting: Gastroenterology

## 2019-04-20 ENCOUNTER — Ambulatory Visit (INDEPENDENT_AMBULATORY_CARE_PROVIDER_SITE_OTHER): Payer: Self-pay | Admitting: Gastroenterology

## 2019-04-20 ENCOUNTER — Telehealth: Payer: Self-pay | Admitting: General Surgery

## 2019-04-20 VITALS — BP 104/60 | HR 62 | Ht 68.0 in | Wt 189.0 lb

## 2019-04-20 DIAGNOSIS — K529 Noninfective gastroenteritis and colitis, unspecified: Secondary | ICD-10-CM

## 2019-04-20 NOTE — Telephone Encounter (Signed)
Pt return call for covid screening

## 2019-04-20 NOTE — Progress Notes (Signed)
     [facility] Progress Note  Chief Complaint: Chronic diarrhea  Subjective  History: Seen as new patient and telemedicine conference 03/16/2019.  Somewhat difficult to characterize symptoms of chronic burning abdominal discomfort and diarrhea.  He had not brought stool specimens to the lab as ordered by primary care. 2013 colonoscopy with Dr. Juanita Craver.  He is still a tangential historian, but I am able to characterize his symptoms a bit better than I did over recent video conference.  He has 2 or 3 months of frequent semi-formed to loose stool, up to 4 to 6/day.  He denies nocturnal diarrhea or incontinence.  He denies bleeding, anorexia or weight loss.  Sometimes he has a burning upper abdominal discomfort that is nonradiating.  He had no new medicines, sick contacts or travel or antibiotic use prior to the onset of symptoms, to the best of his recollection.  He clearly recalls specimens for stool studies since I last spoke with him, and he got a call from that office saying they were normal.     ROS: Cardiovascular:  no chest pain Respiratory: no dyspnea Remainder of systems negative except as above The patient's Past Medical, Family and Social History were reviewed and are on file in the EMR.  Objective:  Med list reviewed  Current Outpatient Medications:  .  omeprazole (PRILOSEC) 20 MG capsule, Take 1 capsule (20 mg total) by mouth daily., Disp: 30 capsule, Rfl: 1   Vital signs in last 24 hrs: Vitals:   04/20/19 1353  BP: 104/60  Pulse: 62    Physical Exam  He is well-appearing  HEENT: sclera anicteric, oral mucosa moist without lesions  Neck: supple, no thyromegaly, JVD or lymphadenopathy  Cardiac: RRR without murmurs, S1S2 heard, no peripheral edema  Pulm: clear to auscultation bilaterally, normal RR and effort noted  Abdomen: soft, no tenderness, with active bowel sounds. No guarding or palpable hepatosplenomegaly.  Skin; warm and dry, no jaundice or  rash  Recent Labs:  03/19/2019 stool studies negative for Salmonella, Shigella and Campylobacter.  No C. difficile testing. Recent normal CBC and CMP by PCP  @ASSESSMENTPLANBEGIN @ Assessment: Encounter Diagnosis  Name Primary?  . Chronic diarrhea Yes   Unclear cause of symptoms.  C. difficile seems unlikely without typical risk factors, but it should be checked.  We have made those arrangements. Consider IBD or microscopic colitis. No weight loss to suggest malabsorptive syndrome.  He is a smoker, but no history of heavy alcohol use.  Seems unlikely to be exocrine pancreatic insufficiency.  I feel he should have a colonoscopy, but he tells me he is unable to do that right now because he lacks medical coverage.  We have given him information for [facility] health [facility] card plan.  In the meantime, he will take Imodium as needed.    Total time 25 minutes, over half spent face-to-face with patient in counseling and coordination of care.   Nelida Meuse III

## 2019-04-20 NOTE — Telephone Encounter (Signed)
Returned call to patient Left a message on voicemail to return my call.

## 2019-04-20 NOTE — Patient Instructions (Signed)
If you are age 59 or older, your body mass index should be between 23-30. Your Body mass index is 28.74 kg/m. If this is out of the aforementioned range listed, please consider follow up with your Primary Care Provider.  If you are age 21 or younger, your body mass index should be between 19-25. Your Body mass index is 28.74 kg/m. If this is out of the aformentioned range listed, please consider follow up with your Primary Care Provider.   It has been recommended to you by your physician that you have a colonoscopy completed. Per your request, we did not schedule the procedure today. Please contact our office at [phone] should you decide to have the procedure completed.  We have given you the financial assistance program information.  It was a pleasure to see you today!  Dr. Loletha Carrow

## 2019-04-20 NOTE — Telephone Encounter (Signed)
LM for patient to contact the office to Covid-19 prescreen for in person visit.

## 2019-04-20 NOTE — Telephone Encounter (Signed)
Pt return call °

## 2019-04-21 ENCOUNTER — Other Ambulatory Visit: Payer: Self-pay

## 2019-04-21 ENCOUNTER — Other Ambulatory Visit: Payer: Self-pay | Admitting: Emergency Medicine

## 2019-04-21 DIAGNOSIS — K529 Noninfective gastroenteritis and colitis, unspecified: Secondary | ICD-10-CM

## 2019-04-23 LAB — CLOSTRIDIUM DIFFICILE TOXIN B, QUALITATIVE, REAL-TIME PCR: Toxigenic C. Difficile by PCR: NOT DETECTED

## 2019-04-27 ENCOUNTER — Telehealth: Payer: Self-pay | Admitting: Gastroenterology

## 2019-04-27 NOTE — Telephone Encounter (Signed)
Pt return call °

## 2019-04-27 NOTE — Telephone Encounter (Signed)
See result note.  

## 2020-08-01 ENCOUNTER — Encounter (HOSPITAL_COMMUNITY): Payer: Self-pay | Admitting: Emergency Medicine

## 2020-08-01 ENCOUNTER — Other Ambulatory Visit: Payer: Self-pay

## 2020-08-01 ENCOUNTER — Ambulatory Visit (HOSPITAL_COMMUNITY)
Admission: EM | Admit: 2020-08-01 | Discharge: 2020-08-01 | Disposition: A | Discharge status: Home / Self Care | Payer: BC Managed Care – PPO | Attending: Family Medicine | Admitting: Family Medicine

## 2020-08-01 DIAGNOSIS — Z79899 Other long term (current) drug therapy: Secondary | ICD-10-CM | POA: Diagnosis not present

## 2020-08-01 DIAGNOSIS — K2100 Gastro-esophageal reflux disease with esophagitis, without bleeding: Secondary | ICD-10-CM | POA: Diagnosis not present

## 2020-08-01 DIAGNOSIS — K529 Noninfective gastroenteritis and colitis, unspecified: Secondary | ICD-10-CM | POA: Diagnosis not present

## 2020-08-01 DIAGNOSIS — Z20822 Contact with and (suspected) exposure to covid-19: Secondary | ICD-10-CM | POA: Diagnosis not present

## 2020-08-01 DIAGNOSIS — R109 Unspecified abdominal pain: Secondary | ICD-10-CM | POA: Insufficient documentation

## 2020-08-01 DIAGNOSIS — G8929 Other chronic pain: Secondary | ICD-10-CM | POA: Insufficient documentation

## 2020-08-01 DIAGNOSIS — R369 Urethral discharge, unspecified: Secondary | ICD-10-CM | POA: Insufficient documentation

## 2020-08-01 DIAGNOSIS — F17210 Nicotine dependence, cigarettes, uncomplicated: Secondary | ICD-10-CM | POA: Insufficient documentation

## 2020-08-01 LAB — POCT URINALYSIS DIPSTICK, ED / UC
Bilirubin Urine: NEGATIVE
Glucose, UA: NEGATIVE mg/dL
Ketones, ur: NEGATIVE mg/dL
Leukocytes,Ua: NEGATIVE
Nitrite: NEGATIVE
Protein, ur: NEGATIVE mg/dL
Specific Gravity, Urine: >=1.03 (ref 1.005–1.030)
Urobilinogen, UA: 0.2 mg/dL (ref 0.0–1.0)
pH: 5.5 (ref 5.0–8.0)

## 2020-08-01 LAB — HIV ANTIBODY (ROUTINE TESTING W REFLEX): HIV Screen 4th Generation wRfx: NONREACTIVE

## 2020-08-01 LAB — SARS CORONAVIRUS 2 (TAT 6-24 HRS): SARS Coronavirus 2: NEGATIVE

## 2020-08-01 MED ORDER — OMEPRAZOLE 20 MG PO CPDR: 20.0000 mg | DELAYED_RELEASE_CAPSULE | Freq: Every day | ORAL | 1 refills | Status: DC

## 2020-08-01 MED ORDER — CEFTRIAXONE SODIUM 500 MG IJ SOLR
INTRAMUSCULAR | Status: AC
  Filled 2020-08-01: qty 500

## 2020-08-01 MED ORDER — LIDOCAINE HCL (PF) 1 % IJ SOLN
INTRAMUSCULAR | Status: AC
  Filled 2020-08-01: qty 2

## 2020-08-01 MED ORDER — DOXYCYCLINE HYCLATE 100 MG PO CAPS: 100.0000 mg | ORAL_CAPSULE | Freq: Two times a day (BID) | ORAL | 0 refills | Status: AC

## 2020-08-01 MED ORDER — LOPERAMIDE HCL 2 MG PO CAPS: 2.0000 mg | ORAL_CAPSULE | Freq: Every day | ORAL | 0 refills | Status: AC

## 2020-08-01 MED ORDER — CEFTRIAXONE SODIUM 500 MG IJ SOLR
500.0000 mg | Freq: Once | INTRAMUSCULAR | Status: AC
  Administered 2020-08-01: 500 mg via INTRAMUSCULAR

## 2020-08-01 NOTE — ED Provider Notes (Signed)
[place]    CSN: [id number] Arrival date & time: 08/01/20  1115      History   Chief Complaint Chief Complaint  Patient presents with  . Abdominal Pain    HPI Dennis Wiggins is a 59 y.o. male.   Patient with history of chronic diarrhea and chronic abdominal pain presents for evaluation of 2 to 3-day history of loose stools and abdominal discomfort.  He states this is similar to previous where he will have a burning and grumbling feeling in his stomach.  He will have loose stools up to 4-5 episodes a day.  Describes these as brown and liquidy.  Denies any blood or dark appearance.  Reports abdominal pain is mild.  He does report this caused him to have to leave work.  Denies any nausea or vomiting.  Denies fever or chills.  Denies cough, runny nose, sore throat.  He has not taken any medicine for this.  Previously followed by GI last seen in 2020.  Also followed by his primary care for same.  States there are no differences between this episode and previous.  Does report over the years that he had this he will have intermittent normal stools.  No abnormal travel.  No abnormal foods.  Last visit progress patient does inquire about STD testing as he states 2 weeks ago he had an extramarital encounter is worried about STD as he has had discharge from his penis.  Describes it as yellow-brown.  Denies painful urination.  Denies testicular pain or discomfort.  Unknown of this partner STD status.  Has not been sexually active with anyone since then.     History reviewed. No pertinent past medical history.  Patient Active Problem List   Diagnosis Date Noted  . Tobacco use disorder 10/16/2015  . Esophageal reflux 10/16/2015  . 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 Benign neoplasm of colon 05/08/2012    History reviewed. No pertinent surgical history.     Home Medications    Prior to Admission medications   Medication Sig Start Date End Date Taking? Authorizing Provider  doxycycline  (VIBRAMYCIN) 100 MG capsule Take 1 capsule (100 mg total) by mouth 2 (two) times daily for 7 days. 08/01/20 08/08/20  Jayden Kratochvil, Marguerita Beards, PA-C  loperamide (IMODIUM) 2 MG capsule Take 1 capsule (2 mg total) by mouth daily for 7 days. 08/01/20 08/08/20  Domonic Kimball, Marguerita Beards, PA-C  omeprazole (PRILOSEC) 20 MG capsule Take 1 capsule (20 mg total) by mouth daily. 08/01/20   Edge Mauger, Marguerita Beards, PA-C    Family History Family History  Problem Relation Age of Onset  . Cancer Mother        unknown type; had chemotherapy    Social History Social History   Tobacco Use  . Smoking status: Current Every Day Smoker    Packs/day: 0.50    Years: 13.00    Pack years: 6.50    Types: Cigarettes  . Smokeless tobacco: Never Used  Substance Use Topics  . Alcohol use: Yes    Alcohol/week: 3.0 standard drinks    Types: 1 Glasses of wine, 1 Cans of beer, 1 Shots of liquor per week  . Drug use: No     Allergies   Patient has no known allergies.   Review of Systems Review of Systems   Physical Exam Triage Vital Signs ED Triage Vitals [08/01/20 1322]  Enc Vitals Group     BP      Pulse      Resp  Temp      Temp src      SpO2      Weight      Height      Head Circumference      Peak Flow      Pain Score 8     Pain Loc      Pain Edu?      Excl. in [place]?    No data found.  Updated Vital Signs BP 103/63 (BP Location: Left Arm)   Pulse 67   Temp 98.7 F (37.1 C) (Oral)   Resp 20   SpO2 100%   Visual Acuity Right Eye Distance:   Left Eye Distance:   Bilateral Distance:    Right Eye Near:   Left Eye Near:    Bilateral Near:     Physical Exam Vitals and nursing note reviewed.  Constitutional:      General: He is not in acute distress.    Appearance: He is well-developed. He is not ill-appearing.  HENT:     Head: Normocephalic and atraumatic.  Eyes:     Conjunctiva/sclera: Conjunctivae normal.  Cardiovascular:     Rate and Rhythm: Normal rate and regular rhythm.     Heart sounds: No murmur  heard.   Pulmonary:     Effort: Pulmonary effort is normal. No respiratory distress.     Breath sounds: Normal breath sounds.  Abdominal:     Palpations: Abdomen is soft.     Tenderness: There is no abdominal tenderness.  Genitourinary:    Penis: Normal.      Testes:        Right: Tenderness not present.        Left: Tenderness not present.     Comments: No discharge, however scant blood on swab.  No frank blood in the meatus.  Testicular swelling. Musculoskeletal:     Cervical back: Neck supple.  Skin:    General: Skin is warm and dry.  Neurological:     Mental Status: He is alert.      UC Treatments / Results  Labs (all labs ordered are listed, but only abnormal results are displayed) Labs Reviewed  POCT URINALYSIS DIPSTICK, ED / UC - Abnormal; Notable for the following components:      Result Value   Hgb urine dipstick MODERATE (*)    All other components within normal limits  SARS CORONAVIRUS 2 (TAT 6-24 HRS)  URINE CULTURE  HIV ANTIBODY (ROUTINE TESTING W REFLEX)  RPR  CYTOLOGY, (ORAL, ANAL, URETHRAL) ANCILLARY ONLY    EKG   Radiology No results found.  Procedures Procedures (including critical care time)  Medications Ordered in UC Medications  cefTRIAXone (ROCEPHIN) injection 500 mg (500 mg Intramuscular Given 08/01/20 1412)    Initial Impression / Assessment and Plan / UC Course  I have reviewed the triage vital signs and the nursing notes.  Pertinent labs & imaging results that were available during my care of the patient were reviewed by me and considered in my medical decision making (see chart for details).     #Chronic diarrhea #Chronic abdominal pain #Urethral discharge Patient is a 59 year old presenting with chronic diarrhea and abdominal pain as well as urethral discharge.  Moderate blood on UA.  Will treat with Rocephin and doxycycline outpatient.  Swab sent.  HIV and RPR sent.  Otherwise benign exam today with regard to his chronic  abdominal pain and diarrhea.  Appears well hydrated.  Work will require Covid prior to return.  Covid sent.  Will encourage use of Prilosec as previously recommended by GI, daily single dose Imodium to limit diarrhea.  Instructed him to follow-up with his primary care and GI specialist for this.  Discussed red flag and emergency department precautions.  Patient verbalized understand plan of care Final Clinical Impressions(s) / UC Diagnoses   Final diagnoses:  Chronic diarrhea  Chronic abdominal pain  Urethral discharge     Discharge Instructions     Take the medication as prescribed  We have sent tests, we will call if we need to change treatments  Abstain from intercourse until all treatments and labs return  Follow up with your primary care and GI specialist  If severe symptoms, go to the Emergency department      ED Prescriptions    Medication Sig Dispense Auth. Provider   omeprazole (PRILOSEC) 20 MG capsule Take 1 capsule (20 mg total) by mouth daily. 30 capsule Danh Bayus, Marguerita Beards, PA-C   loperamide (IMODIUM) 2 MG capsule Take 1 capsule (2 mg total) by mouth daily for 7 days. 7 capsule Avri Paiva, Marguerita Beards, PA-C   doxycycline (VIBRAMYCIN) 100 MG capsule Take 1 capsule (100 mg total) by mouth 2 (two) times daily for 7 days. 14 capsule Romanita Fager, Marguerita Beards, PA-C     PDMP not reviewed this encounter.   Purnell Shoemaker, PA-C 08/01/20 1420

## 2020-08-01 NOTE — Discharge Instructions (Addendum)
Take the medication as prescribed  We have sent tests, we will call if we need to change treatments  Abstain from intercourse until all treatments and labs return  Follow up with your primary care and GI specialist  If severe symptoms, go to the Emergency department

## 2020-08-01 NOTE — ED Triage Notes (Signed)
Abdominal pain started Saturday.  Patient reports diarrhea at that time  Patient talks of stomach grumbling.  Denies vomiting.

## 2020-08-02 LAB — URINE CULTURE: Culture: NO GROWTH

## 2020-08-02 LAB — CYTOLOGY, (ORAL, ANAL, URETHRAL) ANCILLARY ONLY
Chlamydia: POSITIVE — AB
Comment: NEGATIVE
Comment: NEGATIVE
Comment: NORMAL
Neisseria Gonorrhea: NEGATIVE
Trichomonas: NEGATIVE

## 2020-08-02 LAB — RPR: RPR Ser Ql: NONREACTIVE

## 2021-08-24 ENCOUNTER — Ambulatory Visit (INDEPENDENT_AMBULATORY_CARE_PROVIDER_SITE_OTHER): Payer: No Typology Code available for payment source | Admitting: Gastroenterology

## 2021-08-24 ENCOUNTER — Encounter: Payer: Self-pay | Admitting: Gastroenterology

## 2021-08-24 VITALS — BP 100/66 | HR 74 | Ht 69.0 in | Wt 180.6 lb

## 2021-08-24 DIAGNOSIS — R1013 Epigastric pain: Secondary | ICD-10-CM

## 2021-08-24 DIAGNOSIS — R12 Heartburn: Secondary | ICD-10-CM

## 2021-08-24 DIAGNOSIS — K529 Noninfective gastroenteritis and colitis, unspecified: Secondary | ICD-10-CM

## 2021-08-24 DIAGNOSIS — A048 Other specified bacterial intestinal infections: Secondary | ICD-10-CM

## 2021-08-24 DIAGNOSIS — G8929 Other chronic pain: Secondary | ICD-10-CM

## 2021-08-24 MED ORDER — PLENVU 140 G PO SOLR: 140.0000 g | ORAL | 0 refills | Status: DC

## 2021-08-24 MED ORDER — OMEPRAZOLE 40 MG PO CPDR: 40.0000 mg | DELAYED_RELEASE_CAPSULE | Freq: Every day | ORAL | 3 refills | Status: DC

## 2021-08-24 NOTE — Progress Notes (Signed)
[facility] Progress Note  Chief Complaint: Epigastric pain and diarrhea  Subjective  History:  From my 04/20/2019 office note: "Seen as new patient and telemedicine conference 03/16/2019.  Somewhat difficult to characterize symptoms of chronic burning abdominal discomfort and diarrhea.  He had not brought stool specimens to the lab as ordered by primary care. 2013 colonoscopy with Dr. Juanita Craver.   He is still a tangential historian, but I am able to characterize his symptoms a bit better than I did over recent video conference.  He has 2 or 3 months of frequent semi-formed to loose stool, up to 4 to 6/day.  He denies nocturnal diarrhea or incontinence.  He denies bleeding, anorexia or weight loss.  Sometimes he has a burning upper abdominal discomfort that is nonradiating.  He had no new medicines, sick contacts or travel or antibiotic use prior to the onset of symptoms, to the best of his recollection.  He clearly recalls specimens for stool studies since I last spoke with him, and he got a call from that office saying they were normal.  "  Colonoscopy recommended, patient felt he could not do it at that point because he lacked insurance. ____________________________  As before, it was difficult to get an entirely clear and consistent description of symptoms.  He has epigastric burning discomfort and heartburn most days.  If the symptoms are worse he also may feel food getting stuck in the esophagus.  He is bothered by pronounced borborygmi, especially in the morning, and this may herald urgent need for loose nonbloody BM.  He denies black or bloody stool, appetite is variable and he may have lost a few pounds in recent months.  His appetite seems to vary depending on his work schedule, stress and other factors. Sometimes his symptoms, which she describes as reflux, bother him so much he may vomit after a meal. He has a prescription for omeprazole 20 mg daily, it was difficult to tell  how often he has been taking it or how much it may be helping.  ROS: Cardiovascular:  no chest pain Respiratory: no dyspnea Remainder of systems negative except as above The patient's Past Medical, Family and Social History were reviewed and are on file in the EMR. 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   Vital signs in last 24 hrs: Vitals:   08/24/21 1401  BP: 100/66  Pulse: 74  SpO2: 98%   Wt Readings from Last 3 Encounters:  08/24/21 180 lb 9.6 oz (81.9 kg)  04/20/19 189 lb (85.7 kg)  03/16/19 187 lb (84.8 kg)    Physical Exam  Not acutely ill-appearing. HEENT: sclera anicteric, oral mucosa moist without lesions.  Right lateral strabismus Neck: supple, no thyromegaly, JVD or lymphadenopathy Cardiac: RRR without murmurs, S1S2 heard, no peripheral edema Pulm: clear to auscultation bilaterally, normal RR and effort noted Abdomen: soft, no tenderness, with active bowel sounds. No guarding or palpable hepatosplenomegaly. Skin; warm and dry, no jaundice or rash  Labs:  H. pylori breath test positive in November 2016 (physician result note at that time indicates  he was treated with 14 days of amoxicillin and clarithromycin with PPI) ___________________________________________ Radiologic studies:   ____________________________________________ Other:   _____________________________________________ Assessment & Plan  Assessment: Encounter Diagnoses  Name Primary?   Abdominal pain, chronic, epigastric Yes   Heartburn    Chronic diarrhea    Helicobacter pylori infection    Upper abdominal pain and symptoms  consistent with reflux as well as diarrhea.  No fatty/greasy stools to suggest EPI, no history of pancreatitis or reported heavy alcohol use.  No risk factors for SIBO.  Previous H. pylori treatment, unknown if eradication confirmed.   Plan: Omeprazole 40 mg once daily  Upper endoscopy and colonoscopy.  He was agreeable after a thorough discussion of procedures and risks.  The benefits and risks of the planned procedure were described in detail with the patient or (when appropriate) their health care proxy.  Risks were outlined as including, but not limited to, bleeding, infection, perforation, adverse medication reaction leading to cardiac or pulmonary decompensation, pancreatitis (if ERCP).  The limitation of incomplete mucosal visualization was also discussed.  No guarantees or warranties were given.   31 minutes were spent on this encounter (including chart review, history/exam, counseling/coordination of care, and documentation) > 50% of that time was spent on counseling and coordination of care.   Nelida Meuse III

## 2021-08-24 NOTE — Patient Instructions (Signed)
If you are age 61 or older, your body mass index should be between 23-30. Your Body mass index is 26.67 kg/m. If this is out of the aforementioned range listed, please consider follow up with your Primary Care Provider.  If you are age 94 or younger, your body mass index should be between 19-25. Your Body mass index is 26.67 kg/m. If this is out of the aformentioned range listed, please consider follow up with your Primary Care Provider.   __________________________________________________________  The [facility] providers would like to encourage you to use [facility] to communicate with providers for non-urgent requests or questions.  Due to long hold times on the telephone, sending your provider a message by [facility] may be a faster and more efficient way to get a response.  Please allow 48 business hours for a response.  Please remember that this is for non-urgent requests.   You have been scheduled for an endoscopy and colonoscopy. Please follow the written instructions given to you at your visit today. Please pick up your prep supplies at the pharmacy within the next 1-3 days. If you use inhalers (even only as needed), please bring them with you on the day of your procedure.  Due to recent changes in healthcare laws, you may see the results of your imaging and laboratory studies on MyChart before your provider has had a chance to review them.  We understand that in some cases there may be results that are confusing or concerning to you. Not all laboratory results come back in the same time frame and the provider may be waiting for multiple results in order to interpret others.  Please give [place] 48 hours in order for your provider to thoroughly review all the results before contacting the office for clarification of your results.   _______________________________________________________  Food Guidelines for those with chronic digestive trouble:  Many people have difficulty digesting certain foods,  causing a variety of distressing and embarrassing symptoms such as abdominal pain, bloating and gas.  These foods may need to be avoided or consumed in small amounts.  Here are some tips that might be helpful for you.  1.   Lactose intolerance is the difficulty or complete inability to digest lactose, the natural sugar in milk and anything made from milk.  This condition is harmless, common, and can begin any time during life.  Some people can digest a modest amount of lactose while others cannot tolerate any.  Also, not all dairy products contain equal amounts of lactose.  For example, hard cheeses such as parmesan have less lactose than soft cheeses such as cheddar.  Yogurt has less lactose than milk or cheese.  Many packaged foods (even many brands of bread) have milk, so read ingredient lists carefully.  It is difficult to test for lactose intolerance, so just try avoiding lactose as much as possible for a week and see what happens with your symptoms.  If you seem to be lactose intolerant, the best plan is to avoid it (but make sure you get calcium from another source).  The next best thing is to use lactase enzyme supplements, available over the counter everywhere.  Just know that many lactose intolerant people need to take several tablets with each serving of dairy to avoid symptoms.  Lastly, a lot of restaurant food is made with milk or butter.  Many are things you might not suspect, such as mashed potatoes, rice and pasta (cooked with butter) and "grilled" items.  If you  are lactose intolerant, it never hurts to ask your server what has milk or butter.  2.   Fiber is an important part of your diet, but not all fiber is well-tolerated.  Insoluble fiber such as bran is often consumed by normal gut bacteria and converted into gas.  Soluble fiber such as oats, squash, carrots and green beans are typically tolerated better.  3.   Some types of carbohydrates can be poorly digested.  Examples include:  fructose (apples, cherries, pears, raisins and other dried fruits), fructans (onions, zucchini, large amounts of wheat), sorbitol/mannitol/xylitol and sucralose/Splenda (common artificial sweeteners), and raffinose (lentils, broccoli, cabbage, asparagus, brussel sprouts, many types of beans).  Do a Development worker, community for [employer] and you will find helpful information. Beano, a dietary supplement, will often help with raffinose-containing foods.  As with lactase tablets, you may need several per serving.  4.   Whenever possible, avoid processed food&meats and chemical additives.  High fructose corn syrup, a common sweetener, may be difficult to digest.  Eggs and soy (comes from the soybean, and added to many foods now) are other common bloating/gassy foods.  5.  Regarding gluten:  gluten is a protein mainly found in wheat, but also rye and barley.  There is a condition called celiac sprue, which is an inflammatory reaction in the small intestine causing a variety of digestive symptoms.  Blood testing is highly reliable to look for this condition, and sometimes upper endoscopy with small bowel biopsies may be necessary to make the diagnosis.  Many patients who test negative for celiac sprue report improvement in their digestive symptoms when they switch to a gluten-free diet.  However, in these "non-celiac gluten sensitive" patients, the true role of gluten in their symptoms is unclear.  Reducing carbohydrates in general may decrease the gas and bloating caused when gut bacteria consume carbs. Also, some of these patients may actually be intolerant of the baker's yeast in bread products rather than the gluten.  Flatbread and other reduced yeast breads might therefore be tolerated.  There is no specific testing available for most food intolerances, which are discovered mainly by dietary elimination.  Please do not embark on a gluten free diet unless directed by your doctor, as it is highly restrictive, and may lead to  nutritional deficiencies if not carefully monitored.  Lastly, beware of internet claims offering "personalized" tests for food intolerances.  Such testing has no reliable scientific evidence to support its reliability and correlation to symptoms.    6.  The best advice is old advice, especially for those with chronic digestive trouble - try to eat "clean".  Balanced diet, avoid processed food, plenty of fruits and vegetables, cut down the sugar, minimal alcohol, avoid tobacco. Make time to care for yourself, get enough sleep, exercise when you can, reduce stress.  Your guts will thank you for it.   - Dr. [facility]  ____________________________________________________________

## 2021-09-25 ENCOUNTER — Other Ambulatory Visit: Payer: Self-pay

## 2021-09-25 ENCOUNTER — Encounter: Payer: Self-pay | Admitting: Gastroenterology

## 2021-09-25 ENCOUNTER — Ambulatory Visit (AMBULATORY_SURGERY_CENTER): Payer: No Typology Code available for payment source | Admitting: Gastroenterology

## 2021-09-25 VITALS — BP 110/60 | HR 49 | Temp 96.6°F | Resp 13 | Ht 69.0 in | Wt 180.0 lb

## 2021-09-25 DIAGNOSIS — A048 Other specified bacterial intestinal infections: Secondary | ICD-10-CM | POA: Diagnosis not present

## 2021-09-25 DIAGNOSIS — D125 Benign neoplasm of sigmoid colon: Secondary | ICD-10-CM

## 2021-09-25 DIAGNOSIS — G8929 Other chronic pain: Secondary | ICD-10-CM

## 2021-09-25 DIAGNOSIS — D124 Benign neoplasm of descending colon: Secondary | ICD-10-CM | POA: Diagnosis not present

## 2021-09-25 DIAGNOSIS — K529 Noninfective gastroenteritis and colitis, unspecified: Secondary | ICD-10-CM

## 2021-09-25 DIAGNOSIS — R1013 Epigastric pain: Secondary | ICD-10-CM

## 2021-09-25 DIAGNOSIS — D123 Benign neoplasm of transverse colon: Secondary | ICD-10-CM

## 2021-09-25 DIAGNOSIS — K31A0 Gastric intestinal metaplasia, unspecified: Secondary | ICD-10-CM | POA: Diagnosis not present

## 2021-09-25 DIAGNOSIS — R12 Heartburn: Secondary | ICD-10-CM

## 2021-09-25 DIAGNOSIS — K2970 Gastritis, unspecified, without bleeding: Secondary | ICD-10-CM | POA: Diagnosis not present

## 2021-09-25 MED ORDER — SODIUM CHLORIDE 0.9 % IV SOLN: 500.0000 mL | Freq: Once | INTRAVENOUS | Status: DC

## 2021-09-25 NOTE — Progress Notes (Signed)
Pt Drowsy. VSS. To PACU, report to RN. No anesthetic complications noted.  

## 2021-09-25 NOTE — Patient Instructions (Signed)
YOU HAD AN ENDOSCOPIC PROCEDURE TODAY AT THE [facility]:   Refer to the procedure report that was given to you for any specific questions about what was found during the examination.  If the procedure report does not answer your questions, please call your gastroenterologist to clarify.  If you requested that your care partner not be given the details of your procedure findings, then the procedure report has been included in a sealed envelope for you to review at your convenience later.  YOU SHOULD EXPECT: Some feelings of bloating in the abdomen. Passage of more gas than usual.  Walking can help get rid of the air that was put into your GI tract during the procedure and reduce the bloating. If you had a lower endoscopy (such as a colonoscopy or flexible sigmoidoscopy) you may notice spotting of blood in your stool or on the toilet paper. If you underwent a bowel prep for your procedure, you may not have a normal bowel movement for a few days.  Please Note:  You might notice some irritation and congestion in your nose or some drainage.  This is from the oxygen used during your procedure.  There is no need for concern and it should clear up in a day or so.  SYMPTOMS TO REPORT IMMEDIATELY:   Following lower endoscopy (colonoscopy or flexible sigmoidoscopy):  Excessive amounts of blood in the stool  Significant tenderness or worsening of abdominal pains  Swelling of the abdomen that is new, acute  Fever of 100F or higher  For urgent or emergent issues, a gastroenterologist can be reached at any hour by calling [phone]. Do not use MyChart messaging for urgent concerns.    DIET:  We do recommend a small meal at first, but then you may proceed to your regular diet.  Drink plenty of fluids but you should avoid alcoholic beverages for 24 hours.  ACTIVITY:  You should plan to take it easy for the rest of today and you should NOT DRIVE or use heavy machinery until tomorrow (because  of the sedation medicines used during the test).    FOLLOW UP: Our staff will call the number listed on your records 48-72 hours following your procedure to check on you and address any questions or concerns that you may have regarding the information given to you following your procedure. If we do not reach you, we will leave a message.  We will attempt to reach you two times.  During this call, we will ask if you have developed any symptoms of COVID 19. If you develop any symptoms (ie: fever, flu-like symptoms, shortness of breath, cough etc.) before then, please call [phone].  If you test positive for Covid 19 in the 2 weeks post procedure, please call and report this information to us.    If any biopsies were taken you will be contacted by phone or by letter within the next 1-3 weeks.  Please call us at [phone] if you have not heard about the biopsies in 3 weeks.    SIGNATURES/CONFIDENTIALITY: You and/or your care partner have signed paperwork which will be entered into your electronic medical record.  These signatures attest to the fact that that the information above on your After Visit Summary has been reviewed and is understood.  Full responsibility of the confidentiality of this discharge information lies with you and/or your care-partner. 

## 2021-09-25 NOTE — Progress Notes (Signed)
History and Physical:  This patient presents for endoscopic testing for: Encounter Diagnoses  Name Primary?   Abdominal pain, chronic, epigastric    Heartburn    Chronic diarrhea    Helicobacter pylori infection     Clinical details in 08/24/21 office note No changes since then.  ROS: Patient denies chest pain or cough   Past Medical History: Past Medical History:  Diagnosis Date   GERD (gastroesophageal reflux disease)      Past Surgical History: Past Surgical History:  Procedure Laterality Date   COLONOSCOPY      Allergies: No Known Allergies  Outpatient Meds: Current Outpatient Medications  Medication Sig Dispense Refill   omeprazole (PRILOSEC) 40 MG capsule Take 1 capsule (40 mg total) by mouth daily. 90 capsule 3   Current Facility-Administered Medications  Medication Dose Route Frequency Provider Last Rate Last Admin   0.9 %  sodium chloride infusion  500 mL Intravenous Once Danis, Estill Cotta III, MD          ___________________________________________________________________ Objective   Exam:  BP 115/69   Pulse (!) 47   Temp (!) 96.6 F (35.9 C)   Resp 12   Ht 5\' 9"  (1.753 m)   Wt 180 lb (81.6 kg)   SpO2 (!) 78%   BMI 26.58 kg/m   CV: RRR without murmur, S1/S2 Resp: clear to auscultation bilaterally, normal RR and effort noted GI: soft, no tenderness, with active bowel sounds.   Assessment: Encounter Diagnoses  Name Primary?   Abdominal pain, chronic, epigastric    Heartburn    Chronic diarrhea    Helicobacter pylori infection      Plan: Colonoscopy with Bx EGD with Bx   The patient is appropriate for an endoscopic procedure in the ambulatory setting.   - Wilfrid Lund, MD

## 2021-09-25 NOTE — Progress Notes (Signed)
Called to room to assist during endoscopic procedure.  Patient ID and intended procedure confirmed with present staff. Received instructions for my participation in the procedure from the performing physician.  

## 2021-09-25 NOTE — Op Note (Signed)
Dennis Wiggins Procedure Date: 09/25/2021 3:06 PM MRN: [id number] Endoscopist: Mallie Mussel L. Loletha Wiggins , MD Age: 60 Referring MD:  Date of Birth: November 28, 1961 Gender: Male Account #: [id number] Procedure:                Upper GI endoscopy Indications:              Epigastric abdominal pain, Previously treated for                            Helicobacter pylori Medicines:                Monitored Anesthesia Care Procedure:                Pre-Anesthesia Assessment:                           - Prior to the procedure, a History and Physical                            was performed, and patient medications and                            allergies were reviewed. The patient's tolerance of                            previous anesthesia was also reviewed. The risks                            and benefits of the procedure and the sedation                            options and risks were discussed with the patient.                            All questions were answered, and informed consent                            was obtained. Prior Anticoagulants: The patient has                            taken no previous anticoagulant or antiplatelet                            agents. ASA Grade Assessment: II - A patient with                            mild systemic disease. After reviewing the risks                            and benefits, the patient was deemed in                            satisfactory condition to undergo the procedure.  After obtaining informed consent, the endoscope was                            passed under direct vision. Throughout the                            procedure, the patient's blood pressure, pulse, and                            oxygen saturations were monitored continuously. The                            GIF [id number] #[id number] was introduced through the                            mouth, and advanced to the second part  of duodenum.                            The upper GI endoscopy was accomplished without                            difficulty. The patient tolerated the procedure                            well. Scope In: Scope Out: Findings:                 The larynx was normal.                           The esophagus was normal.                           The entire examined stomach was normal. Biopsies                            were taken with a cold forceps for histology                            (antrum and body, to rule out persistent H pylori).                           The cardia and gastric fundus were normal on                            retroflexion.                           The examined duodenum was normal. Complications:            No immediate complications. Estimated Blood Loss:     Estimated blood loss was minimal. Impression:               - Normal larynx.                           - Normal esophagus.                           -  Normal stomach. Biopsied.                           - Normal examined duodenum. Recommendation:           - Patient has a contact number available for                            emergencies. The signs and symptoms of potential                            delayed complications were discussed with the                            patient. Return to normal activities tomorrow.                            Written discharge instructions were provided to the                            patient.                           - Resume previous diet.                           - Continue present medications.                           - Await pathology results.                           - See the other procedure note for documentation of                            additional recommendations. Dennis Losh L. Loletha Carrow, MD 09/25/2021 3:54:11 PM This report has been signed electronically.

## 2021-09-25 NOTE — Op Note (Signed)
[facility] Patient Name: Dennis Wiggins Procedure Date: 09/25/2021 3:06 PM MRN: [id number] Endoscopist: Mallie Mussel L. Loletha Wiggins , MD Age: 60 Referring MD:  Date of Birth: 1961/08/12 Gender: Male Account #: [id number] Procedure:                Colonoscopy Indications:              Chronic diarrhea Medicines:                Monitored Anesthesia Care Procedure:                Pre-Anesthesia Assessment:                           - Prior to the procedure, a History and Physical                            was performed, and patient medications and                            allergies were reviewed. The patient's tolerance of                            previous anesthesia was also reviewed. The risks                            and benefits of the procedure and the sedation                            options and risks were discussed with the patient.                            All questions were answered, and informed consent                            was obtained. Prior Anticoagulants: The patient has                            taken no previous anticoagulant or antiplatelet                            agents. ASA Grade Assessment: II - A patient with                            mild systemic disease. After reviewing the risks                            and benefits, the patient was deemed in                            satisfactory condition to undergo the procedure.                           After obtaining informed consent, the colonoscope  was passed under direct vision. Throughout the                            procedure, the patient's blood pressure, pulse, and                            oxygen saturations were monitored continuously. The                            CF [id number] #[id number] was introduced through the anus                            and advanced to the the terminal ileum, with                            identification of the appendiceal orifice and  IC                            valve. The colonoscopy was performed without                            difficulty. The patient tolerated the procedure                            well. The quality of the bowel preparation was                            good. The terminal ileum, ileocecal valve,                            appendiceal orifice, and rectum were photographed. Scope In: 3:14:11 PM Scope Out: 3:35:45 PM Scope Withdrawal Time: 0 hours 17 minutes 47 seconds  Total Procedure Duration: 0 hours 21 minutes 34 seconds  Findings:                 The perianal and digital rectal examinations were                            normal.                           The terminal ileum appeared normal.                           Normal mucosa was found in the entire colon.                            Biopsies for histology were taken with a cold                            forceps from the right colon and left colon for                            evaluation of microscopic colitis.  Two sessile polyps were found in the transverse                            colon. The polyps were diminutive in size. These                            polyps were removed with a cold snare. Resection                            and retrieval were complete.                           A diminutive polyp was found in the descending                            colon. The polyp was flat. The polyp was removed                            with a cold biopsy forceps. Resection and retrieval                            were complete.                           A diminutive polyp was found in the sigmoid colon.                            The polyp was sessile. The polyp was removed with a                            cold snare. Resection and retrieval were complete.                           The exam was otherwise without abnormality on                            direct and retroflexion views. Complications:             No immediate complications. Estimated Blood Loss:     Estimated blood loss was minimal. Impression:               - The examined portion of the ileum was normal.                           - Normal mucosa in the entire examined colon.                            Biopsied.                           - Two diminutive polyps in the transverse colon,                            removed with a cold snare. Resected and retrieved.                           -  One diminutive polyp in the descending colon,                            removed with a cold biopsy forceps. Resected and                            retrieved.                           - One diminutive polyp in the sigmoid colon,                            removed with a cold snare. Resected and retrieved.                           - The examination was otherwise normal on direct                            and retroflexion views. Recommendation:           - Patient has a contact number available for                            emergencies. The signs and symptoms of potential                            delayed complications were discussed with the                            patient. Return to normal activities tomorrow.                            Written discharge instructions were provided to the                            patient.                           - Resume previous diet.                           - Continue present medications.                           - Await pathology results.                           - Repeat colonoscopy is recommended for                            surveillance. The colonoscopy date will be                            determined after pathology results from today's                            exam  become available for review.                           - See the other procedure note for documentation of                            additional recommendations. Dennis Kissler L. Loletha Carrow, MD 09/25/2021 3:51:50 PM This report  has been signed electronically.

## 2021-09-27 ENCOUNTER — Telehealth: Payer: Self-pay | Admitting: *Deleted

## 2021-09-27 NOTE — Telephone Encounter (Signed)
First follow up call attempt.  LVM. 

## 2021-09-27 NOTE — Telephone Encounter (Signed)
Attempted 2nd f/u phone call. No answer. Left message.  °

## 2021-10-04 ENCOUNTER — Other Ambulatory Visit: Payer: Self-pay

## 2021-10-04 ENCOUNTER — Encounter: Payer: Self-pay | Admitting: Gastroenterology

## 2021-10-04 MED ORDER — DOXYCYCLINE HYCLATE 100 MG PO CAPS: 100.0000 mg | ORAL_CAPSULE | Freq: Two times a day (BID) | ORAL | 0 refills | Status: AC

## 2021-10-04 MED ORDER — OMEPRAZOLE 20 MG PO CPDR: 20.0000 mg | DELAYED_RELEASE_CAPSULE | Freq: Two times a day (BID) | ORAL | 0 refills | Status: DC

## 2021-10-04 MED ORDER — BISMUTH SUBSALICYLATE 262 MG PO TABS: 524.0000 mg | ORAL_TABLET | Freq: Four times a day (QID) | ORAL | 0 refills | Status: AC

## 2021-10-04 MED ORDER — DICYCLOMINE HCL 10 MG PO CAPS: ORAL_CAPSULE | ORAL | 1 refills | Status: DC

## 2021-10-04 MED ORDER — METRONIDAZOLE 500 MG PO TABS: 500.0000 mg | ORAL_TABLET | Freq: Three times a day (TID) | ORAL | 0 refills | Status: AC

## 2021-11-16 ENCOUNTER — Other Ambulatory Visit: Payer: No Typology Code available for payment source

## 2021-11-16 ENCOUNTER — Ambulatory Visit (INDEPENDENT_AMBULATORY_CARE_PROVIDER_SITE_OTHER): Payer: No Typology Code available for payment source | Admitting: Nurse Practitioner

## 2021-11-16 ENCOUNTER — Encounter: Payer: Self-pay | Admitting: Nurse Practitioner

## 2021-11-16 VITALS — BP 124/74 | HR 52 | Ht 69.0 in | Wt 179.4 lb

## 2021-11-16 DIAGNOSIS — B9681 Helicobacter pylori [H. pylori] as the cause of diseases classified elsewhere: Secondary | ICD-10-CM | POA: Diagnosis not present

## 2021-11-16 DIAGNOSIS — K2970 Gastritis, unspecified, without bleeding: Secondary | ICD-10-CM

## 2021-11-16 NOTE — Patient Instructions (Addendum)
Finish antibiotic this weekend, then stop omeprazole. Then 14 days later on 12/04/2021 to our lab located in the basement.  Your provider has requested that you go to the basement level for lab work before leaving today. Press "B" on the elevator. The lab is located at the first door on the left as you exit the elevator.  If you are age 61 or older, your body mass index should be between 23-30. Your Body mass index is 26.49 kg/m. If this is out of the aforementioned range listed, please consider follow up with your Primary Care Provider.  If you are age 73 or younger, your body mass index should be between 19-25. Your Body mass index is 26.49 kg/m. If this is out of the aformentioned range listed, please consider follow up with your Primary Care Provider.   ________________________________________________________  The [facility] providers would like to encourage you to use [facility] to communicate with providers for non-urgent requests or questions.  Due to long hold times on the telephone, sending your provider a message by [facility] may be a faster and more efficient way to get a response.  Please allow 48 business hours for a response.  Please remember that this is for non-urgent requests.  _______________________________________________________  It was a pleasure to see you today!  Thank you for trusting me with your gastrointestinal care!

## 2021-11-16 NOTE — Progress Notes (Signed)
ASSESSMENT AND PLAN    #60 year old male with H.pylori gastritis with intestinal metaplasia on EGD Oct 2022 . Didn't take quadruple treatment as prescribed, taking everything once a day.  --Complete remainder of antibiotics ( will finish in 2 days) then hold Omeprazole for 14 day and check stool H.pylori Ag.Will notify him of results --Of note he had H.pylori in 2016, supposedly treated with antibiotics. Follow up eradication testing wasn't done   --Follow up EGD in 3 years  # History of adenomatous colon polyps 2022.  A 5 year recall colonoscopy is recommended.   HISTORY OF PRESENT ILLNESS    Chief Complaint :  follow up on abdominal pain and chronic diarrhea  Dennis Wiggins is a 60 y.o. male known to Dr. Loletha Carrow with a past medical history of chronic diarrhea, tobacco use, H. pylori gastritis. See additional medical history as listed in PMH .   Patient was last seen late September 2022 for evaluation of upper abdominal pain and diarrhea.   He had a history of H. pylori which was apparently treated but it was unknown if there had been eradication.  He was started on 40 mg of omeprazole daily and scheduled for EGD and colonoscopy.  EGD was normal.  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  Several small tubular adenomatous polyps were removed from the colon.  He was prescribed quadruple therapy for H. Pylori. He is here for follow-up   INTERVAL HISTORY:   Tionne says he is about to finish up with the antibiotics. Antibiotics should have been finish high l he did not read instructions on antibiotics.  He has been taking Metronidazole and Doxycycline ONLY ONCE a day. He has a couple of days of antibiotics left. Taking Bismuth 1-2 times a day and Omeprazole 1-2 times a day. He feels fine. Though he has chronic diarrhea lately his BMs have been soft. He is having about one BM a day. No abdominal pain    PREVIOUS ENDOSCOPIC EVALUATIONS /  PERTINENT STUDIES:   Oct 2022 EGD --Normal  Colonoscopy 2022 --The examined portion of the ileum was normal. - Normal mucosa in the entire examined colon. Biopsied. - Two diminutive polyps in the transverse colon, removed with a cold snare. Resected and retrieved. - One diminutive polyp in the descending colon, removed with a cold biopsy forceps. Resected and retrieved. - One diminutive polyp in the sigmoid colon, removed with a cold snare. Resected and retrieved. - The examination was otherwise normal on direct and retroflexion views.  Surgical [P], random colon biopsy - UNREMARKABLE COLONIC MUCOSA. - NO MICROSCOPIC COLITIS, ACTIVE INFLAMMATION OR CHRONIC CHANGES. 2. Surgical [P], colon, transverse, polyp (2) - TUBULAR ADENOMA (2) WITHOUT HIGH GRADE DYSPLASIA. 3. Surgical [P], colon, descending, sigmoid, polyp (2) - TUBULAR ADENOMA (1) WITHOUT HIGH GRADE DYSPLASIA. - BENIGN POLYPOID COLONIC MUCOSA. 4. Surgical [P], random gastric antrum and gastric body biopsy (treated H. 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 - WARTHIN-STARRY POSITIVE FOR HELICOBACTER PYLORI.    Current Medications, Allergies, Past Medical History, Past Surgical History, Family History and Social History were reviewed in [employer] record.     Current Outpatient Medications  Medication Sig Dispense Refill   dicyclomine (BENTYL) 10 MG capsule Take 1 capsule (10 mg total) by mouth 2-3 times daily as needed for diarrhea 45 capsule 1   omeprazole (PRILOSEC) 40 MG capsule Take 1 capsule (40 mg total) by mouth daily. 90 capsule 3  No current facility-administered medications for this visit.    Review of Systems: No chest pain. No shortness of breath. No urinary complaints.   PHYSICAL EXAM :    Wt Readings from Last 3 Encounters:  11/16/21 179 lb 6.4 oz (81.4 kg)  09/25/21 180 lb (81.6 kg)  08/24/21 180 lb 9.6 oz (81.9 kg)    BP 124/74     Pulse (!) 52    Ht 5\' 9"  (1.753 m)    Wt 179 lb 6.4 oz (81.4 kg)    BMI 26.49 kg/m  Constitutional:  Generally well appearing male in no acute distress. Psychiatric: Pleasant. Normal mood and affect. Behavior is normal. EENT: Pupils normal.  Conjunctivae are normal. No scleral icterus. Neck supple.  Cardiovascular: Normal rate, regular rhythm. No edema Pulmonary/chest: Effort normal and breath sounds normal. No wheezing, rales or rhonchi. Abdominal: Soft, nondistended, nontender. Bowel sounds active throughout. There are no masses palpable. No hepatomegaly. Neurological: Alert and oriented to person place and time. Skin: Skin is warm and dry. No rashes noted.  Tye Savoy, NP  11/16/2021, 10:04 AM

## 2021-11-27 NOTE — Progress Notes (Signed)
____________________________________________________________  Attending physician addendum:  Thank you for sending this case to me. I have reviewed the entire note and agree with the plan.  Unfortunate that he was not attentive to and adherent with the H. pylori treatment regimen, especially since he failed previous amoxicillin and clarithromycin.  Follow-up urea breath test shows persistent infection, then he will most likely need a course of Talicia and, if still positive afterward, referral to infectious disease.  Wilfrid Lund, MD  ____________________________________________________________

## 2021-12-04 ENCOUNTER — Other Ambulatory Visit: Payer: No Typology Code available for payment source

## 2021-12-04 DIAGNOSIS — K2970 Gastritis, unspecified, without bleeding: Secondary | ICD-10-CM

## 2021-12-05 LAB — HELICOBACTER PYLORI  SPECIAL ANTIGEN
MICRO NUMBER:: 12823585
RESULT:: DETECTED — AB
SPECIMEN QUALITY: ADEQUATE

## 2021-12-10 ENCOUNTER — Other Ambulatory Visit: Payer: Self-pay

## 2021-12-10 MED ORDER — TALICIA 250-12.5-10 MG PO CPDR: 4.0000 | DELAYED_RELEASE_CAPSULE | Freq: Three times a day (TID) | ORAL | 0 refills | Status: AC

## 2021-12-10 MED ORDER — TALICIA 250-12.5-10 MG PO CPDR: 4.0000 | DELAYED_RELEASE_CAPSULE | Freq: Three times a day (TID) | ORAL | 0 refills | Status: DC

## 2021-12-13 ENCOUNTER — Telehealth: Payer: Self-pay | Admitting: Nurse Practitioner

## 2021-12-13 NOTE — Telephone Encounter (Signed)
I spoke with the [facility] pharmacy technician. The pharmacist was able to get the price down from $742.00 with his insurance to $300 with the insurance and the Teresita coupon. This is not an affordable prescription for the patient. [facility] will reach out to the pharmaceutical rep and ask for a sample for this patient. Called the patient to let him know of the plan. No answer. Left a voicemail with this information.

## 2021-12-13 NOTE — Telephone Encounter (Signed)
Patient called and stated that the Talicia medication that he was proscribed is too much and he cannot afford it. Seeking advice if there is a different medication he can take. Please advise.

## 2021-12-17 NOTE — Telephone Encounter (Signed)
Spoke with the patient about the sample obtained. Written instructions provided. He will pick up the sample. Reports he is very motivated to eradicate the H Pylori infection.

## 2022-01-01 ENCOUNTER — Telehealth: Payer: Self-pay

## 2022-01-01 NOTE — Telephone Encounter (Addendum)
°  Called the patient to remind him about being off PPI for 2 weeks before his repeat H Pylori stool test. He started the Talecia on 12/17/21. He should complete it by 12/31/21 or 01/01/22. Off PPI for 2 weeks then test which will be after 01/04/12/23. The patient is on his way to see his mother. She has been sick and hospitalized. He will call me upon his return to [place].

## 2022-01-07 ENCOUNTER — Telehealth: Payer: Self-pay | Admitting: Nurse Practitioner

## 2022-01-07 NOTE — Telephone Encounter (Signed)
Lm on vm for pt to return call

## 2022-01-07 NOTE — Telephone Encounter (Signed)
Patient returned your call, please advise. 

## 2022-01-07 NOTE — Telephone Encounter (Signed)
Patient called and stated that he wanted to speak to the nurse about Talecia medication. Seeking advice, please advise.

## 2022-01-08 MED ORDER — OMEPRAZOLE 40 MG PO CPDR: DELAYED_RELEASE_CAPSULE | ORAL | 3 refills | Status: DC

## 2022-01-08 NOTE — Telephone Encounter (Signed)
Spoke with the patient. We discussed the next step of H Pylori eradication. He will take Omeprazole for 2 weeks, then stop. He will wait for 2 more weeks, then go to the lab for the stool test kit.  Patient instructed using the teach back method. (His mother is going to a rehab facility. Sounds like she maybe had a stroke)

## 2022-01-28 ENCOUNTER — Telehealth: Payer: Self-pay | Admitting: Nurse Practitioner

## 2022-01-28 ENCOUNTER — Other Ambulatory Visit: Payer: Self-pay

## 2022-01-28 DIAGNOSIS — K2970 Gastritis, unspecified, without bleeding: Secondary | ICD-10-CM

## 2022-01-28 DIAGNOSIS — B9681 Helicobacter pylori [H. pylori] as the cause of diseases classified elsewhere: Secondary | ICD-10-CM

## 2022-01-28 NOTE — Telephone Encounter (Signed)
Spoke with the patient. He stopped Omeprazole 01/24/22. He will remain off of the medication. On 02/07/22 he will have been off the Omeprazole for 14 days. He will go to the lab on 02/11/22 to collect his container for the stool specimen. Orders for H Pylori by PCR in Epic.

## 2022-01-28 NOTE — Telephone Encounter (Signed)
Patient finished taking the medication prescribed and wants to know what his next steps are.  Please call and advise.  Thank you.

## 2022-02-18 ENCOUNTER — Other Ambulatory Visit: Payer: No Typology Code available for payment source

## 2022-02-18 DIAGNOSIS — B9681 Helicobacter pylori [H. pylori] as the cause of diseases classified elsewhere: Secondary | ICD-10-CM

## 2022-02-20 LAB — H. PYLORI ANTIGEN, STOOL: H pylori Ag, Stl: NEGATIVE

## 2022-07-30 ENCOUNTER — Ambulatory Visit (HOSPITAL_COMMUNITY)
Admission: EM | Admit: 2022-07-30 | Discharge: 2022-07-30 | Disposition: A | Discharge status: Home / Self Care | Payer: No Typology Code available for payment source | Attending: Physician Assistant | Admitting: Physician Assistant

## 2022-07-30 ENCOUNTER — Encounter (HOSPITAL_COMMUNITY): Payer: Self-pay | Admitting: *Deleted

## 2022-07-30 DIAGNOSIS — G44209 Tension-type headache, unspecified, not intractable: Secondary | ICD-10-CM

## 2022-07-30 MED ORDER — CYCLOBENZAPRINE HCL 5 MG PO TABS
5.0000 mg | ORAL_TABLET | Freq: Three times a day (TID) | ORAL | 0 refills | Status: DC | PRN
Start: 2022-07-30 — End: 2024-03-29

## 2022-07-30 NOTE — Discharge Instructions (Signed)
Recommend Tylenol or Excedrin as needed for headache Can take Flexeril as needed for neck spasm and tension Recommend stretching while at work.  Drink plenty of fluids Follow up with your primary care physician  If symptoms become more severe can return here for evaluation.

## 2022-07-30 NOTE — ED Triage Notes (Signed)
Pt states that he has a lot of head pressure but not a headache mostly on the left side. He is having some pain in the left side of his neck. These sx have been going on since last week. He hasn't tried anything OTC.   He states that his left foot is tingling since last week as well.

## 2022-07-30 NOTE — ED Provider Notes (Signed)
[facility]    CSN: [id number] Arrival date & time: 07/30/22  1615      History   Chief Complaint Chief Complaint  Patient presents with   head pressure    HPI Dennis Wiggins is a 61 y.o. male.   Pt complains of intermittent left sided headache that started last week.  He also complains of right upper back and shoulder discomfort.  He denies visual changes, n/v, congestion, sinus pain/pressure.  He reports he took some tylenol which did provide some relief.  He is left handed.  Reports some heavy lifting required at work.  Denies injury or trauma.    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 Authorizing Provider  cyclobenzaprine (FLEXERIL) 5 MG tablet Take 1 tablet (5 mg total) by mouth 3 (three) times daily as needed for muscle spasms. 07/30/22  Yes Ward, Lenise Arena, PA-C  dicyclomine (BENTYL) 10 MG capsule Take 1 capsule (10 mg total) by mouth 2-3 times daily as needed for diarrhea 10/04/21   Doran Stabler, MD  omeprazole (PRILOSEC) 40 MG capsule Take 1 cap daily at least 30 minutes before breakfast for 14 days then stop for 14 days 01/08/22   Willia Craze, NP    Family History Family History  Problem Relation Age of Onset   Cancer Mother        unknown type; had chemotherapy   Colon cancer Neg Hx    Esophageal cancer Neg Hx    Rectal cancer Neg Hx    Stomach cancer Neg Hx     Social History Social History   Tobacco Use   Smoking status: Every Day    Packs/day: 0.50    Years: 13.00    Total pack years: 6.50    Types: Cigarettes   Smokeless  tobacco: Never  Substance Use Topics   Alcohol use: Yes    Alcohol/week: 3.0 standard drinks of alcohol    Types: 1 Glasses of wine, 1 Cans of beer, 1 Shots of liquor per week   Drug use: No     Allergies   Patient has no known allergies.   Review of Systems Review of Systems  Constitutional:  Negative for chills and fever.  HENT:  Negative for ear pain and sore throat.   Eyes:  Negative for pain and visual disturbance.  Respiratory:  Negative for cough and shortness of breath.   Cardiovascular:  Negative for chest pain and palpitations.  Gastrointestinal:  Negative for abdominal pain and vomiting.  Genitourinary:  Negative for dysuria and hematuria.  Musculoskeletal:  Positive for neck pain. Negative for arthralgias and back pain.  Skin:  Negative for color change and rash.  Neurological:  Positive for headaches. Negative for seizures and syncope.  All other systems reviewed and are negative.    Physical Exam Triage Vital Signs ED Triage Vitals [07/30/22 1739]  Enc Vitals Group     BP 116/78     Pulse Rate 63     Resp 16     Temp 98.2  F (36.8 C)     Temp Source Oral     SpO2 96 %     Weight      Height      Head Circumference      Peak Flow      Pain Score      Pain Loc      Pain Edu?      Excl. in [place]?    No data found.  Updated Vital Signs BP 116/78 (BP Location: Left Arm)   Pulse 63   Temp 98.2 F (36.8 C) (Oral)   Resp 16   SpO2 96%   Visual Acuity Right Eye Distance:   Left Eye Distance:   Bilateral Distance:    Right Eye Near:   Left Eye Near:    Bilateral Near:     Physical Exam Vitals and nursing note reviewed.  Constitutional:      General: He is not in acute distress.    Appearance: He is well-developed.  HENT:     Head: Normocephalic and atraumatic.  Eyes:     Conjunctiva/sclera: Conjunctivae normal.  Cardiovascular:     Rate and Rhythm: Normal rate and regular rhythm.     Heart sounds: No murmur heard. Pulmonary:      Effort: Pulmonary effort is normal. No respiratory distress.     Breath sounds: Normal breath sounds.  Abdominal:     Palpations: Abdomen is soft.     Tenderness: There is no abdominal tenderness.  Musculoskeletal:        General: No swelling.     Cervical back: Neck supple.  Skin:    General: Skin is warm and dry.     Capillary Refill: Capillary refill takes less than 2 seconds.  Neurological:     Mental Status: He is alert.  Psychiatric:        Mood and Affect: Mood normal.      UC Treatments / Results  Labs (all labs ordered are listed, but only abnormal results are displayed) Labs Reviewed - No data to display  EKG   Radiology No results found.  Procedures Procedures (including critical care time)  Medications Ordered in UC Medications - No data to display  Initial Impression / Assessment and Plan / UC Course  I have reviewed the triage vital signs and the nursing notes.  Pertinent labs & imaging results that were available during my care of the patient were reviewed by me and considered in my medical decision making (see chart for details).     Normal neuro exam.  Headache possibly tension type, related to left trapezius muscular soreness and spasm.  He has experienced relief with Tylenol.  Advised continued supportive care, stretching.  Flexeril prescribed to use as needed for muscular spasm.  Advised to follow up with PCP.  Return precautions given.  Final Clinical Impressions(s) / UC Diagnoses   Final diagnoses:  Acute non intractable tension-type headache     Discharge Instructions      Recommend Tylenol or Excedrin as needed for headache Can take Flexeril as needed for neck spasm and tension Recommend stretching while at work.  Drink plenty of fluids Follow up with your primary care physician  If symptoms become more severe can return here for evaluation.    ED Prescriptions     Medication Sig Dispense Auth. Provider   cyclobenzaprine  (FLEXERIL) 5 MG tablet Take 1 tablet (5 mg total) by mouth 3 (three) times daily as needed for muscle spasms. [place]  tablet Ward, Lenise Arena, PA-C      PDMP not reviewed this encounter.   Ward, Lenise Arena, PA-C 07/30/22 1821

## 2024-03-29 ENCOUNTER — Ambulatory Visit (HOSPITAL_COMMUNITY)
Admission: EM | Admit: 2024-03-29 | Discharge: 2024-03-29 | Disposition: A | Discharge status: Home / Self Care | Attending: Emergency Medicine | Admitting: Emergency Medicine

## 2024-03-29 ENCOUNTER — Encounter (HOSPITAL_COMMUNITY): Payer: Self-pay

## 2024-03-29 DIAGNOSIS — S29012A Strain of muscle and tendon of back wall of thorax, initial encounter: Secondary | ICD-10-CM

## 2024-03-29 DIAGNOSIS — R079 Chest pain, unspecified: Secondary | ICD-10-CM | POA: Diagnosis not present

## 2024-03-29 MED ORDER — MELOXICAM 7.5 MG PO TABS: 7.5000 mg | ORAL_TABLET | Freq: Every day | ORAL | 0 refills | Status: AC

## 2024-03-29 MED ORDER — METHOCARBAMOL 500 MG PO TABS: 500.0000 mg | ORAL_TABLET | Freq: Two times a day (BID) | ORAL | 0 refills | Status: AC

## 2024-03-29 NOTE — ED Triage Notes (Signed)
 Patient c/o right mid back pain and mid lower chest pain x 3 days. Patient states when at work he was pulling on a pipe and the pain worsened.  Patient denies SOB.

## 2024-03-29 NOTE — ED Provider Notes (Signed)
 [facility]    CSN: [id number] Arrival date & time: 03/29/24  1627      History   Chief Complaint No chief complaint on file.   HPI Dennis Wiggins is a 63 y.o. male.   Patient presents to clinic over concerns of central chest pain that wraps around to his right posterior back.  Symptoms have been persistent over the past 3 days.  He does not know what triggered them or what started it, but today at work he was lifting a pipe and pulling on it when he felt the pain.  Pain is not present unless he twists a certain way.  Not having any left-sided chest pain or shortness of breath.  No cardiac history.  Has not tried any medications or interventions for the pain other than stretching, but this seems to trigger the pain.   The history is provided by the patient and medical records.    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 Authorizing Provider  meloxicam  (MOBIC ) 7.5 MG tablet Take 1 tablet (7.5 mg total) by mouth daily for 15 days. 03/29/24 04/13/24 Yes Addis Bennie  N, FNP  methocarbamol (ROBAXIN) 500 MG tablet Take 1 tablet (500 mg total) by mouth 2 (two) times daily. 03/29/24  Yes Harlow Lighter, Mallerie Blok  N, FNP    Family History Family History  Problem Relation Age of Onset   Cancer Mother        unknown type; had chemotherapy   Colon cancer Neg Hx    Esophageal cancer Neg Hx    Rectal cancer Neg Hx    Stomach cancer Neg Hx     Social History Social History   Tobacco Use   Smoking status: Every Day    Current packs/day: 0.25    Average packs/day: 0.3  packs/day for 13.0 years (3.3 ttl pk-yrs)    Types: Cigarettes   Smokeless tobacco: Never  Vaping Use   Vaping status: Never Used  Substance Use Topics   Alcohol use: Yes    Alcohol/week: 3.0 standard drinks of alcohol    Types: 1 Glasses of wine, 1 Cans of beer, 1 Shots of liquor per week   Drug use: No     Allergies   Patient has no known allergies.   Review of Systems Review of Systems  Per HPI  Physical Exam Triage Vital Signs ED Triage Vitals [03/29/24 1831]  Encounter Vitals Group     BP      Systolic BP Percentile      Diastolic BP Percentile      Pulse      Resp      Temp      Temp src      SpO2      Weight      Height      Head Circumference      Peak Flow      Pain Score 10     Pain Loc      Pain Education      Exclude from Growth Chart  No data found.  Updated Vital Signs BP 107/73 (BP Location: Left Arm)   Pulse (!) 50   Temp 98 F (36.7 C) (Oral)   Resp 16   SpO2 98%   Visual Acuity Right Eye Distance:   Left Eye Distance:   Bilateral Distance:    Right Eye Near:   Left Eye Near:    Bilateral Near:     Physical Exam Vitals and nursing note reviewed.  Constitutional:      Appearance: Normal appearance.  HENT:     Head: Normocephalic and atraumatic.     Right Ear: External ear normal.     Left Ear: External ear normal.     Nose: Nose normal.     Mouth/Throat:     Mouth: Mucous membranes are moist.  Eyes:     Conjunctiva/sclera: Conjunctivae normal.  Cardiovascular:     Rate and Rhythm: Normal rate and regular rhythm.     Heart sounds: Normal heart sounds. No murmur heard. Pulmonary:     Effort: Pulmonary effort is normal. No respiratory distress.     Breath sounds: Normal breath sounds.  Chest:    Musculoskeletal:       Arms:  Skin:    General: Skin is warm and dry.  Neurological:     General: No focal deficit present.     Mental Status: He is alert.  Psychiatric:        Mood and Affect: Mood normal.       UC Treatments / Results  Labs (all labs ordered are listed, but only abnormal results are displayed) Labs Reviewed - No data to display  EKG   Radiology No results found.  Procedures Procedures (including critical care time)  Medications Ordered in UC Medications - No data to display  Initial Impression / Assessment and Plan / UC Course  I have reviewed the triage vital signs and the nursing notes.  Pertinent labs & imaging results that were available during my care of the patient were reviewed by me and considered in my medical decision making (see chart for details).  Vitals in triage reviewed, patient is hemodynamically stable.  Lungs are vesicular, heart with regular rate and rhythm, bradycardic.  EKG shows sinus bradycardia, no ST elevation or ST depression.  Without chest pain on physical exam.  There is central sternal pain that is reproducible to palpation.  Right sided thoracic back pain that is reproducible with movement.  Overall presentation consistent with a muscle strain/soft tissue injury.  Symptomatic management discussed.  Plan of care, follow-up care return precautions given, no questions at this time.  Work note provided.     Final Clinical Impressions(s) / UC Diagnoses   Final diagnoses:  Muscle strain of right upper back, initial encounter  Central chest pain     Discharge Instructions      Your aches and pains seem muscular in nature.  You can take the Robaxin up to 2 times daily.  Do not drink alcohol or drive on this medication as it may cause drowsiness or sedation.  You can take the Mobic  daily to help with pain and inflammation.  Stop this medication if you develop stomach upset or blood in your stool.  Seek immediate care at the nearest emergency department if your chest pain changes or worsens in any way.     ED Prescriptions     Medication Sig Dispense Auth. Provider   methocarbamol (ROBAXIN) 500 MG tablet Take 1 tablet (500 mg  total)  by mouth 2 (two) times daily. 20 tablet Harlow Lighter, Bowman Higbie  N, FNP   meloxicam  (MOBIC ) 7.5 MG tablet Take 1 tablet (7.5 mg total) by mouth daily for 15 days. 15 tablet Harlow Lighter, Cirilo Canner  N, FNP      PDMP not reviewed this encounter.   Antonieta Kitten, FNP 03/29/24 1920

## 2024-03-29 NOTE — Discharge Instructions (Addendum)
 Your aches and pains seem muscular in nature.  You can take the Robaxin up to 2 times daily.  Do not drink alcohol or drive on this medication as it may cause drowsiness or sedation.  You can take the Mobic  daily to help with pain and inflammation.  Stop this medication if you develop stomach upset or blood in your stool.  Seek immediate care at the nearest emergency department if your chest pain changes or worsens in any way.
